# Patient Record
Sex: Male | Born: 1960 | Race: White | Hispanic: No | Smoking: Former smoker
Health system: Southern US, Community
[De-identification: ages and names within clinical notes are randomized; demographics above are authoritative.]

## PROBLEM LIST (undated history)

## (undated) DIAGNOSIS — K219 Gastro-esophageal reflux disease without esophagitis: Secondary | ICD-10-CM

## (undated) DIAGNOSIS — Z0000 Encounter for general adult medical examination without abnormal findings: Secondary | ICD-10-CM

## (undated) DIAGNOSIS — Z1273 Encounter for screening for malignant neoplasm of ovary: Secondary | ICD-10-CM

## (undated) DIAGNOSIS — J019 Acute sinusitis, unspecified: Secondary | ICD-10-CM

## (undated) DIAGNOSIS — M79609 Pain in unspecified limb: Secondary | ICD-10-CM

## (undated) DIAGNOSIS — J309 Allergic rhinitis, unspecified: Secondary | ICD-10-CM

## (undated) DIAGNOSIS — N410 Acute prostatitis: Secondary | ICD-10-CM

## (undated) DIAGNOSIS — M545 Low back pain: Secondary | ICD-10-CM

## (undated) DIAGNOSIS — N400 Enlarged prostate without lower urinary tract symptoms: Secondary | ICD-10-CM

## (undated) DIAGNOSIS — T7840XA Allergy, unspecified, initial encounter: Secondary | ICD-10-CM

## (undated) DIAGNOSIS — G4700 Insomnia, unspecified: Secondary | ICD-10-CM

## (undated) DIAGNOSIS — E785 Hyperlipidemia, unspecified: Secondary | ICD-10-CM

## (undated) DIAGNOSIS — F988 Other specified behavioral and emotional disorders with onset usually occurring in childhood and adolescence: Secondary | ICD-10-CM

## (undated) HISTORY — DX: Acute sinusitis, unspecified: J01.90

## (undated) HISTORY — DX: Encounter for general adult medical examination without abnormal findings: Z00.00

## (undated) HISTORY — DX: Benign prostatic hyperplasia without lower urinary tract symptoms: N40.0

## (undated) HISTORY — DX: Hyperlipidemia, unspecified: E78.5

## (undated) HISTORY — DX: Other specified behavioral and emotional disorders with onset usually occurring in childhood and adolescence: F98.8

## (undated) HISTORY — PX: VASECTOMY: SHX75

## (undated) HISTORY — PX: WISDOM TOOTH EXTRACTION: SHX21

## (undated) HISTORY — DX: Insomnia, unspecified: G47.00

## (undated) HISTORY — DX: Low back pain: M54.5

## (undated) HISTORY — DX: Allergy, unspecified, initial encounter: T78.40XA

## (undated) HISTORY — DX: Gastro-esophageal reflux disease without esophagitis: K21.9

## (undated) HISTORY — DX: Encounter for screening for malignant neoplasm of ovary: Z12.73

## (undated) HISTORY — PX: SKIN CANCER EXCISION: SHX779

## (undated) HISTORY — DX: Acute prostatitis: N41.0

## (undated) HISTORY — DX: Pain in unspecified limb: M79.609

## (undated) HISTORY — DX: Allergic rhinitis, unspecified: J30.9

---

## 2004-01-18 ENCOUNTER — Ambulatory Visit: Payer: Self-pay | Admitting: Internal Medicine

## 2004-04-25 ENCOUNTER — Ambulatory Visit: Payer: Self-pay | Admitting: Internal Medicine

## 2004-10-11 ENCOUNTER — Ambulatory Visit: Payer: Self-pay | Admitting: Internal Medicine

## 2006-03-18 ENCOUNTER — Ambulatory Visit: Payer: Self-pay | Admitting: Internal Medicine

## 2006-03-18 LAB — CONVERTED CEMR LAB
AST: 21 units/L (ref 0–37)
Basophils Absolute: 0.1 10*3/uL (ref 0.0–0.1)
Bilirubin, Direct: 0.1 mg/dL (ref 0.0–0.3)
Chloride: 106 meq/L (ref 96–112)
Cholesterol: 209 mg/dL (ref 0–200)
Creatinine, Ser: 0.9 mg/dL (ref 0.4–1.5)
Direct LDL: 154.9 mg/dL
Eosinophils Absolute: 0.2 10*3/uL (ref 0.0–0.6)
Eosinophils Relative: 4.4 % (ref 0.0–5.0)
Glucose, Bld: 96 mg/dL (ref 70–99)
HCT: 42.8 % (ref 39.0–52.0)
Hemoglobin, Urine: NEGATIVE
Hemoglobin: 15.1 g/dL (ref 13.0–17.0)
Ketones, ur: NEGATIVE mg/dL
Leukocytes, UA: NEGATIVE
MCHC: 35.1 g/dL (ref 30.0–36.0)
MCV: 91.1 fL (ref 78.0–100.0)
Monocytes Absolute: 0.6 10*3/uL (ref 0.2–0.7)
Neutrophils Relative %: 44.8 % (ref 43.0–77.0)
Nitrite: NEGATIVE
PSA: 0.38 ng/mL (ref 0.10–4.00)
Potassium: 4.2 meq/L (ref 3.5–5.1)
RBC: 4.7 M/uL (ref 4.22–5.81)
RDW: 12 % (ref 11.5–14.6)
Sodium: 142 meq/L (ref 135–145)
Total Bilirubin: 0.7 mg/dL (ref 0.3–1.2)
Total CHOL/HDL Ratio: 5.2
Urobilinogen, UA: 0.2 (ref 0.0–1.0)
WBC: 5.4 10*3/uL (ref 4.5–10.5)

## 2006-03-21 ENCOUNTER — Ambulatory Visit: Payer: Self-pay | Admitting: Internal Medicine

## 2007-09-18 ENCOUNTER — Telehealth: Payer: Self-pay | Admitting: Internal Medicine

## 2007-11-03 ENCOUNTER — Encounter: Payer: Self-pay | Admitting: Internal Medicine

## 2007-11-13 ENCOUNTER — Ambulatory Visit: Payer: Self-pay | Admitting: Internal Medicine

## 2007-11-13 LAB — CONVERTED CEMR LAB
ALT: 22 units/L (ref 0–53)
Alkaline Phosphatase: 52 units/L (ref 39–117)
BUN: 13 mg/dL (ref 6–23)
Bilirubin, Direct: 0.1 mg/dL (ref 0.0–0.3)
CO2: 29 meq/L (ref 19–32)
Calcium: 9.6 mg/dL (ref 8.4–10.5)
Eosinophils Relative: 2.8 % (ref 0.0–5.0)
Glucose, Bld: 89 mg/dL (ref 70–99)
Hemoglobin: 14.8 g/dL (ref 13.0–17.0)
Leukocytes, UA: NEGATIVE
Lymphocytes Relative: 39.3 % (ref 12.0–46.0)
Monocytes Relative: 11.9 % (ref 3.0–12.0)
Neutro Abs: 2.5 10*3/uL (ref 1.4–7.7)
Nitrite: NEGATIVE
PSA: 0.24 ng/mL (ref 0.10–4.00)
RBC: 4.54 M/uL (ref 4.22–5.81)
RDW: 11.6 % (ref 11.5–14.6)
Sodium: 142 meq/L (ref 135–145)
Specific Gravity, Urine: 1.015 (ref 1.000–1.03)
Total CHOL/HDL Ratio: 5.4
Total Protein, Urine: NEGATIVE mg/dL
Total Protein: 7.1 g/dL (ref 6.0–8.3)
WBC: 5.6 10*3/uL (ref 4.5–10.5)
pH: 7 (ref 5.0–8.0)

## 2007-11-18 ENCOUNTER — Ambulatory Visit: Payer: Self-pay | Admitting: Internal Medicine

## 2007-11-18 DIAGNOSIS — N400 Enlarged prostate without lower urinary tract symptoms: Secondary | ICD-10-CM

## 2007-11-18 DIAGNOSIS — M545 Low back pain, unspecified: Secondary | ICD-10-CM

## 2007-11-18 DIAGNOSIS — J309 Allergic rhinitis, unspecified: Secondary | ICD-10-CM | POA: Insufficient documentation

## 2007-11-18 DIAGNOSIS — E785 Hyperlipidemia, unspecified: Secondary | ICD-10-CM | POA: Insufficient documentation

## 2007-11-18 DIAGNOSIS — N138 Other obstructive and reflux uropathy: Secondary | ICD-10-CM | POA: Insufficient documentation

## 2007-11-18 HISTORY — DX: Hyperlipidemia, unspecified: E78.5

## 2007-11-18 HISTORY — DX: Benign prostatic hyperplasia without lower urinary tract symptoms: N40.0

## 2007-11-18 HISTORY — DX: Allergic rhinitis, unspecified: J30.9

## 2007-11-18 HISTORY — DX: Low back pain, unspecified: M54.50

## 2008-01-04 ENCOUNTER — Telehealth: Payer: Self-pay | Admitting: Internal Medicine

## 2008-02-24 ENCOUNTER — Ambulatory Visit: Payer: Self-pay | Admitting: Internal Medicine

## 2008-02-24 DIAGNOSIS — N410 Acute prostatitis: Secondary | ICD-10-CM | POA: Insufficient documentation

## 2008-02-24 DIAGNOSIS — F988 Other specified behavioral and emotional disorders with onset usually occurring in childhood and adolescence: Secondary | ICD-10-CM

## 2008-02-24 DIAGNOSIS — M79609 Pain in unspecified limb: Secondary | ICD-10-CM

## 2008-02-24 HISTORY — DX: Other specified behavioral and emotional disorders with onset usually occurring in childhood and adolescence: F98.8

## 2008-02-24 HISTORY — DX: Acute prostatitis: N41.0

## 2008-02-24 HISTORY — DX: Pain in unspecified limb: M79.609

## 2008-02-24 LAB — CONVERTED CEMR LAB
Bacteria, UA: NEGATIVE
Ketones, ur: NEGATIVE mg/dL
Mucus, UA: NEGATIVE
RBC / HPF: NONE SEEN
Specific Gravity, Urine: 1.015 (ref 1.000–1.03)
Urine Glucose: NEGATIVE mg/dL
pH: 8 (ref 5.0–8.0)

## 2008-10-11 ENCOUNTER — Encounter: Payer: Self-pay | Admitting: Internal Medicine

## 2008-12-23 ENCOUNTER — Ambulatory Visit: Payer: Self-pay | Admitting: Internal Medicine

## 2008-12-23 LAB — CONVERTED CEMR LAB
ALT: 31 units/L (ref 0–53)
Albumin: 4.5 g/dL (ref 3.5–5.2)
BUN: 12 mg/dL (ref 6–23)
Basophils Relative: 0.2 % (ref 0.0–3.0)
Chloride: 105 meq/L (ref 96–112)
Cholesterol: 214 mg/dL — ABNORMAL HIGH (ref 0–200)
Creatinine, Ser: 1 mg/dL (ref 0.4–1.5)
Direct LDL: 158 mg/dL
Eosinophils Absolute: 0.1 10*3/uL (ref 0.0–0.7)
Hemoglobin: 15.3 g/dL (ref 13.0–17.0)
Ketones, ur: NEGATIVE mg/dL
MCHC: 33.9 g/dL (ref 30.0–36.0)
MCV: 96.6 fL (ref 78.0–100.0)
Monocytes Absolute: 0.7 10*3/uL (ref 0.1–1.0)
Neutro Abs: 3.4 10*3/uL (ref 1.4–7.7)
RBC: 4.68 M/uL (ref 4.22–5.81)
Specific Gravity, Urine: 1.015 (ref 1.000–1.030)
TSH: 0.8 microintl units/mL (ref 0.35–5.50)
Total Bilirubin: 0.7 mg/dL (ref 0.3–1.2)
Total Protein, Urine: NEGATIVE mg/dL
Urine Glucose: NEGATIVE mg/dL
Urobilinogen, UA: 0.2 (ref 0.0–1.0)
VLDL: 15 mg/dL (ref 0.0–40.0)
pH: 6.5 (ref 5.0–8.0)

## 2008-12-28 ENCOUNTER — Ambulatory Visit: Payer: Self-pay | Admitting: Internal Medicine

## 2008-12-28 DIAGNOSIS — J019 Acute sinusitis, unspecified: Secondary | ICD-10-CM

## 2008-12-28 HISTORY — DX: Acute sinusitis, unspecified: J01.90

## 2009-07-26 ENCOUNTER — Telehealth: Payer: Self-pay | Admitting: Internal Medicine

## 2009-09-26 ENCOUNTER — Encounter: Payer: Self-pay | Admitting: Internal Medicine

## 2009-10-24 ENCOUNTER — Telehealth (INDEPENDENT_AMBULATORY_CARE_PROVIDER_SITE_OTHER): Payer: Self-pay | Admitting: *Deleted

## 2009-10-25 ENCOUNTER — Encounter: Payer: Self-pay | Admitting: Internal Medicine

## 2009-10-25 DIAGNOSIS — G4700 Insomnia, unspecified: Secondary | ICD-10-CM

## 2009-10-25 HISTORY — DX: Insomnia, unspecified: G47.00

## 2009-10-26 ENCOUNTER — Encounter: Payer: Self-pay | Admitting: Internal Medicine

## 2010-01-26 ENCOUNTER — Telehealth: Payer: Self-pay | Admitting: Internal Medicine

## 2010-03-06 NOTE — Progress Notes (Signed)
Summary: PA-Aimbien  Phone Note From Pharmacy   Summary of Call: PA-[facility]imbien, called [facility] @ [phone], awaiting  form. Scott Walker  October 24, 2009 3:39 PM  PA Faxed tp [facility] @ [phone], case # [id number], awaiting approval. Scott Walker  October 26, 2009 12:28 PM  PA-Zolpidem approved 10/05/09-10/26/10, case # [id number], pt aware.  Initial call taken by: Scott Walker,  October 27, 2009 10:53 AM

## 2010-03-06 NOTE — Progress Notes (Signed)
Summary: medication Refill  Phone Note Refill Request Message from:  Fax from Pharmacy on July 26, 2009 9:58 AM  Refills Requested: Medication #1:  AMBIEN 10 MG  TABS 1 by mouth at bedtime as needed   Dosage confirmed as above?Dosage Confirmed   Last Refilled: 12/28/2008   Notes: [facility] fax#[phone] Initial call taken by: Zella Ball Ewing CMA Duncan Dull),  July 26, 2009 9:58 AM  Follow-up for Phone Call        Rx faxed to pharmacy Follow-up by: Margaret Pyle, CMA,  July 26, 2009 10:33 AM    New/Updated Medications: AMBIEN 10 MG  TABS (ZOLPIDEM TARTRATE) 1 by mouth at bedtime as needed Prescriptions: AMBIEN 10 MG  TABS (ZOLPIDEM TARTRATE) 1 by mouth at bedtime as needed  #90 x 1   Entered and Authorized by:   Corwin Levins MD   Signed by:   Corwin Levins MD on 07/26/2009   Method used:   Print then Give to Patient   RxID:   [id number]  done hardcopy to [facility] side B - dahlia  Corwin Levins MD  July 26, 2009 10:27 AM

## 2010-03-06 NOTE — Letter (Signed)
Summary: [employer] Screening  [employer] Screening   Imported By: Sherian Rein 09/28/2009 08:09:18  _____________________________________________________________________  External Attachment:    Type:   Image     Comment:   External Document

## 2010-03-06 NOTE — Medication Information (Signed)
Summary: Approval and P Auth/[facility]  Approval and P Auth/[facility]   Imported By: Lester [place] 10/31/2009 09:06:22  _____________________________________________________________________  External Attachment:    Type:   Image     Comment:   External Document

## 2010-03-06 NOTE — Miscellaneous (Signed)
  Clinical Lists Changes  Problems: Added new problem of INSOMNIA-SLEEP DISORDER-UNSPEC (ICD-780.52)

## 2010-03-08 NOTE — Progress Notes (Signed)
Summary: medication refill  Phone Note Refill Request Message from:  Fax from Pharmacy on January 26, 2010 8:47 AM  Refills Requested: Medication #1:  AMBIEN 10 MG  TABS 1 by mouth at bedtime as needed   Dosage confirmed as above?Dosage Confirmed   Last Refilled: 07/26/2009   Notes: [facility], [phone] Initial call taken by: Robin Ewing CMA (AAMA),  January 26, 2010 8:48 AM    New/Updated Medications: AMBIEN 10 MG  TABS (ZOLPIDEM TARTRATE) 1 by mouth at bedtime as needed - please make return office visit for further refills Prescriptions: AMBIEN 10 MG  TABS (ZOLPIDEM TARTRATE) 1 by mouth at bedtime as needed - please make return office visit for further refills  #90 x 0   Entered and Authorized by:   Corwin Levins MD   Signed by:   Corwin Levins MD on 01/26/2010   Method used:   Print then Give to Patient   RxID:   [id number]  done hardcopy to [facility] side B - dahlia  Corwin Levins MD  January 26, 2010 10:48 AM   Rx faxed to pharmacy Margaret Pyle, CMA  January 26, 2010 10:53 AM

## 2010-04-26 ENCOUNTER — Ambulatory Visit: Payer: 59

## 2010-04-26 DIAGNOSIS — Z0000 Encounter for general adult medical examination without abnormal findings: Secondary | ICD-10-CM

## 2010-04-26 DIAGNOSIS — Z1273 Encounter for screening for malignant neoplasm of ovary: Secondary | ICD-10-CM

## 2010-04-26 HISTORY — DX: Encounter for general adult medical examination without abnormal findings: Z00.00

## 2010-04-26 HISTORY — DX: Encounter for screening for malignant neoplasm of ovary: Z12.73

## 2010-04-26 LAB — BASIC METABOLIC PANEL
CO2: 25 mEq/L (ref 19–32)
Chloride: 104 mEq/L (ref 96–112)
Sodium: 136 mEq/L (ref 135–145)

## 2010-04-26 LAB — CBC WITH DIFFERENTIAL/PLATELET
Basophils Absolute: 0 10*3/uL (ref 0.0–0.1)
Eosinophils Relative: 2.7 % (ref 0.0–5.0)
Hemoglobin: 14.8 g/dL (ref 13.0–17.0)
Lymphocytes Relative: 32.5 % (ref 12.0–46.0)
Monocytes Relative: 11.2 % (ref 3.0–12.0)
Neutro Abs: 3.2 10*3/uL (ref 1.4–7.7)
Platelets: 255 10*3/uL (ref 150.0–400.0)
RDW: 13.5 % (ref 11.5–14.6)
WBC: 6 10*3/uL (ref 4.5–10.5)

## 2010-04-26 LAB — URINALYSIS
Leukocytes, UA: NEGATIVE
Nitrite: NEGATIVE
Specific Gravity, Urine: <=1.005 (ref 1.000–1.030)
pH: 5.5 (ref 5.0–8.0)

## 2010-04-26 LAB — HEPATIC FUNCTION PANEL
ALT: 33 U/L (ref 0–53)
Alkaline Phosphatase: 42 U/L (ref 39–117)
Bilirubin, Direct: 0.1 mg/dL (ref 0.0–0.3)
Total Protein: 6.6 g/dL (ref 6.0–8.3)

## 2010-04-26 LAB — LIPID PANEL: Total CHOL/HDL Ratio: 4

## 2010-04-26 LAB — PSA: PSA: 0.38 ng/mL (ref 0.10–4.00)

## 2010-05-03 ENCOUNTER — Encounter: Payer: Self-pay | Admitting: Internal Medicine

## 2010-05-03 ENCOUNTER — Ambulatory Visit (INDEPENDENT_AMBULATORY_CARE_PROVIDER_SITE_OTHER): Payer: 59 | Admitting: Internal Medicine

## 2010-05-03 VITALS — BP 130/80 | HR 64 | Temp 97.9°F | Ht 69.0 in | Wt 164.2 lb

## 2010-05-03 DIAGNOSIS — E785 Hyperlipidemia, unspecified: Secondary | ICD-10-CM

## 2010-05-03 DIAGNOSIS — Z0001 Encounter for general adult medical examination with abnormal findings: Secondary | ICD-10-CM | POA: Insufficient documentation

## 2010-05-03 DIAGNOSIS — Z0000 Encounter for general adult medical examination without abnormal findings: Secondary | ICD-10-CM | POA: Insufficient documentation

## 2010-05-03 DIAGNOSIS — M545 Low back pain, unspecified: Secondary | ICD-10-CM

## 2010-05-03 DIAGNOSIS — G4700 Insomnia, unspecified: Secondary | ICD-10-CM

## 2010-05-03 MED ORDER — TRAMADOL HCL 50 MG PO TABS: 50.0000 mg | ORAL_TABLET | Freq: Three times a day (TID) | ORAL | Status: DC | PRN

## 2010-05-03 MED ORDER — ZOLPIDEM TARTRATE 10 MG PO TABS: 10.0000 mg | ORAL_TABLET | Freq: Every evening | ORAL | Status: DC | PRN

## 2010-05-03 MED ORDER — FINASTERIDE 5 MG PO TABS: 5.0000 mg | ORAL_TABLET | Freq: Every day | ORAL | Status: DC

## 2010-05-03 NOTE — Assessment & Plan Note (Signed)
stable overall by hx and exam, most recent lab reviewed with pt, and pt to continue medical treatment as before 

## 2010-05-03 NOTE — Assessment & Plan Note (Signed)

## 2010-05-03 NOTE — Assessment & Plan Note (Signed)
stable overall by hx and exam, most recent lab reviewed with pt, and pt to continue medical treatment as before  For med refill today

## 2010-05-03 NOTE — Assessment & Plan Note (Addendum)
Currently not takiing the welchl as he is out, will cont as is for now, check lipids as above, goal ldl < 100

## 2010-05-03 NOTE — Patient Instructions (Signed)
The current medical regimen is effective;  continue present plan and medications.  Please return in 1 year for your yearly visit, or sooner if needed, with Lab testing done 3-5 days before

## 2010-05-03 NOTE — Progress Notes (Signed)
Subjective:    Patient ID: Scott Walker, male    DOB: Oct 09, 1960, 50 y.o.   MRN: [id number]  HPI  Here for wellness and f/u;  Overall doing ok;  Pt denies CP, worsening SOB, DOE, wheezing, orthopnea, PND, worsening LE edema, palpitations, dizziness or syncope.  Pt denies neurological change such as new Headache, facial or extremity weakness.  Pt denies polydipsia, polyuria, or low sugar symptoms. Pt states overall good compliance with treatment and medications, good tolerability, and trying to follow lower cholesterol diet.  Pt denies worsening depressive symptoms, suicidal ideation or panic. No fever, wt loss, night sweats, loss of appetite, or other constitutional symptoms.  Pt states good ability with ADL's, low fall risk, home safety reviewed and adequate, no significant changes in hearing or vision, and occasionally active with exercise.  Does need tramadol refill for the back pain -; just finished a bottle 60 pills from 2 yrs ago - only uses very occasionally  And Pt continues to have recurring LBP without change in severity, bowel or bladder change, fever, wt loss,  worsening LE pain/numbness/weakness, gait change or falls.    Past Medical History  Diagnosis Date  . HYPERLIPIDEMIA 11/18/2007  . ADD 02/24/2008  . SINUSITIS- ACUTE-NOS 12/28/2008  . ALLERGIC RHINITIS 11/18/2007  . BENIGN PROSTATIC HYPERTROPHY 11/18/2007  . PROSTATITIS, ACUTE 02/24/2008  . LOW BACK PAIN 11/18/2007  . ARM PAIN, LEFT 02/24/2008  . INSOMNIA-SLEEP DISORDER-UNSPEC 10/25/2009  . Routine general medical examination at a health care facility 04/26/2010  . Special screening for malignant neoplasms, ovary 04/26/2010   Past Surgical History  Procedure Date  . Vasectomy     reports that he has been smoking Cigars.  He does not have any smokeless tobacco history on file. He reports that he drinks alcohol. His drug history not on file. family history is not on file. Allergies  Allergen Reactions  . Statins     Current Outpatient Prescriptions on File Prior to Visit  Medication Sig Dispense Refill  . aspirin 81 MG EC tablet Take 81 mg by mouth daily.        . Colesevelam HCl [facility]) 3.75 G PACK Take by mouth. 1 pack with water per day       . finasteride (PROSCAR) 5 MG tablet Take 5 mg by mouth daily.        . fluticasone (FLONASE) 50 MCG/ACT nasal spray 2 sprays by Nasal route daily.        . traMADol (ULTRAM) 50 MG tablet Take 50 mg by mouth. 1 by mouth  Two times a day as needed for pain       . zolpidem (AMBIEN) 10 MG tablet Take 10 mg by mouth at bedtime as needed. Please make return visit for further refills       . azithromycin (ZITHROMAX) 250 MG tablet Take 2 tablets by mouth on day 1, followed by 1 tablet by mouth daily for 4 days. 2 by mouth for 1 day then 1 by mouth for 4 days then stop          Review of Systems Review of Systems  Constitutional: Negative for diaphoresis, activity change, appetite change and unexpected weight change.  HENT: Negative for hearing loss, ear pain, facial swelling, mouth sores and neck stiffness.   Eyes: Negative for pain, redness and visual disturbance.  Respiratory: Negative for shortness of breath and wheezing.   Cardiovascular: Negative for chest pain and palpitations.  Gastrointestinal: Negative for diarrhea, blood in stool, abdominal  distention and rectal pain.  Genitourinary: Negative for hematuria, flank pain and decreased urine volume.  Musculoskeletal: Negative for myalgias and joint swelling.  Skin: Negative for color change and wound.  Neurological: Negative for syncope and numbness.  Hematological: Negative for adenopathy.  Psychiatric/Behavioral: Negative for hallucinations, self-injury, decreased concentration and agitation.      Objective:   Physical Exam Physical Exam  VS noted Constitutional: Pt is oriented to person, place, and time. Appears well-developed and well-nourished.  HENT:  Head: Normocephalic and atraumatic.  Right  Ear: External ear normal.  Left Ear: External ear normal.  Nose: Nose normal.  Mouth/Throat: Oropharynx is clear and moist.  Eyes: Conjunctivae and EOM are normal. Pupils are equal, round, and reactive to light.  Neck: Normal range of motion. Neck supple. No JVD present. No tracheal deviation present.  Cardiovascular: Normal rate, regular rhythm, normal heart sounds and intact distal pulses.   Pulmonary/Chest: Effort normal and breath sounds normal.  Abdominal: Soft. Bowel sounds are normal. There is no tenderness.  Musculoskeletal: Normal range of motion. Exhibits no edema.  Lymphadenopathy:  Has no cervical adenopathy.  Neurological: Pt is alert and oriented to person, place, and time. Pt has normal reflexes. No cranial nerve deficit.  Skin: Skin is warm and dry. No rash noted.  Psychiatric:  Has  normal mood and affect. Behavior is normal.         Assessment & Plan:

## 2010-11-06 ENCOUNTER — Other Ambulatory Visit: Payer: Self-pay

## 2010-11-06 MED ORDER — ZOLPIDEM TARTRATE 10 MG PO TABS: ORAL_TABLET | ORAL | Status: DC

## 2010-11-06 NOTE — Telephone Encounter (Signed)
Done hardcopy to robin  

## 2010-11-07 NOTE — Telephone Encounter (Signed)
Faxed hardcopy to [facility] at [phone]

## 2011-01-15 ENCOUNTER — Other Ambulatory Visit: Payer: Self-pay

## 2011-01-15 DIAGNOSIS — M545 Low back pain, unspecified: Secondary | ICD-10-CM

## 2011-01-15 MED ORDER — FINASTERIDE 5 MG PO TABS: 5.0000 mg | ORAL_TABLET | Freq: Every day | ORAL | Status: DC

## 2011-01-15 MED ORDER — TRAMADOL HCL 50 MG PO TABS: 50.0000 mg | ORAL_TABLET | Freq: Three times a day (TID) | ORAL | Status: DC | PRN

## 2011-01-15 NOTE — Telephone Encounter (Signed)
Done per emr 

## 2011-01-21 ENCOUNTER — Telehealth: Payer: Self-pay

## 2011-01-21 NOTE — Telephone Encounter (Signed)
[facility] request clarification on Tramadol directions. Take 1 tab by mouth every 8 hrs prn for pain or take 1 tab two times a day prn pain, Please advise

## 2011-01-21 NOTE — Telephone Encounter (Signed)
Bid prn would be correct

## 2011-01-22 NOTE — Telephone Encounter (Signed)
Called pharmacy left detailed message of MD's correct instructions on Tramadol

## 2011-02-14 ENCOUNTER — Telehealth: Payer: Self-pay

## 2011-02-14 NOTE — Telephone Encounter (Signed)
I suspect ? Neurological in nature;  If no assoc pain,sob, diaphoresis, palp, dizziness, n/v can watch for now, consider OV if persists in 1-2 days

## 2011-02-14 NOTE — Telephone Encounter (Signed)
Pt advised and will call back PRN 

## 2011-02-14 NOTE — Telephone Encounter (Signed)
After Dr Jonny Ruiz reviewed - Monitor sxs and make OV if persistent. MD aware no associated sxs per pt.

## 2011-02-14 NOTE — Telephone Encounter (Signed)
Pt called c/o of tingling in LT arm that has been going on all day. Pt denied any other sxs such as SOB, HA, diaphoresis. Pt says that sxs began when he woke up this morning and his arm was numb. He thought that it was related to sleeping position but tingling has persisted. Pt is requesting MD advise - OV?

## 2011-07-05 ENCOUNTER — Other Ambulatory Visit: Payer: Self-pay

## 2011-07-05 MED ORDER — ZOLPIDEM TARTRATE 10 MG PO TABS: ORAL_TABLET | ORAL | Status: DC

## 2011-07-05 NOTE — Telephone Encounter (Signed)
Done hardcopy to robin  

## 2011-07-05 NOTE — Telephone Encounter (Signed)
Faxed hardcopy to pharmacy. 

## 2011-08-27 ENCOUNTER — Emergency Department (HOSPITAL_COMMUNITY)
Admission: EM | Admit: 2011-08-27 | Discharge: 2011-08-27 | Disposition: A | Discharge status: Home / Self Care | Payer: 59 | Attending: Emergency Medicine | Admitting: Emergency Medicine

## 2011-08-27 ENCOUNTER — Encounter (HOSPITAL_COMMUNITY): Payer: Self-pay | Admitting: Emergency Medicine

## 2011-08-27 DIAGNOSIS — F17200 Nicotine dependence, unspecified, uncomplicated: Secondary | ICD-10-CM | POA: Insufficient documentation

## 2011-08-27 DIAGNOSIS — F988 Other specified behavioral and emotional disorders with onset usually occurring in childhood and adolescence: Secondary | ICD-10-CM | POA: Insufficient documentation

## 2011-08-27 DIAGNOSIS — T189XXA Foreign body of alimentary tract, part unspecified, initial encounter: Secondary | ICD-10-CM | POA: Insufficient documentation

## 2011-08-27 DIAGNOSIS — T18108A Unspecified foreign body in esophagus causing other injury, initial encounter: Secondary | ICD-10-CM

## 2011-08-27 DIAGNOSIS — IMO0002 Reserved for concepts with insufficient information to code with codable children: Secondary | ICD-10-CM | POA: Insufficient documentation

## 2011-08-27 DIAGNOSIS — E785 Hyperlipidemia, unspecified: Secondary | ICD-10-CM | POA: Insufficient documentation

## 2011-08-27 MED ORDER — GLUCAGON HCL (RDNA) 1 MG IJ SOLR
1.0000 mg | Freq: Once | INTRAMUSCULAR | Status: AC
  Administered 2011-08-27: 1 mg via INTRAMUSCULAR
  Filled 2011-08-27: qty 1

## 2011-08-27 NOTE — ED Provider Notes (Signed)
History     CSN: [id number]  Arrival date & time 08/27/11  1644   First MD Initiated Contact with Patient 08/27/11 1822      Chief Complaint  Patient presents with  . Swallowed Foreign Body    (Consider location/radiation/quality/duration/timing/severity/associated sxs/prior treatment) Patient is a 51 y.o. male presenting with foreign body swallowed. The history is provided by the patient.  Swallowed Foreign Body This is a new problem. The current episode started today. The problem occurs constantly. The problem has been unchanged. Associated symptoms include a sore throat. Pertinent negatives include no abdominal pain, chest pain, congestion, coughing, fatigue, fever, headaches, nausea, neck pain, numbness, rash, vomiting or weakness. The symptoms are aggravated by drinking and eating. He has tried nothing for the symptoms. The treatment provided no relief.    Past Medical History  Diagnosis Date  . HYPERLIPIDEMIA 11/18/2007  . ADD 02/24/2008  . SINUSITIS- ACUTE-NOS 12/28/2008  . ALLERGIC RHINITIS 11/18/2007  . BENIGN PROSTATIC HYPERTROPHY 11/18/2007  . PROSTATITIS, ACUTE 02/24/2008  . LOW BACK PAIN 11/18/2007  . ARM PAIN, LEFT 02/24/2008  . INSOMNIA-SLEEP DISORDER-UNSPEC 10/25/2009  . Routine general medical examination at a health care facility 04/26/2010  . Special screening for malignant neoplasms, ovary 04/26/2010    Past Surgical History  Procedure Date  . Vasectomy     History reviewed. No pertinent family history.  History  Substance Use Topics  . Smoking status: Current Some Day Smoker    Types: Cigars  . Smokeless tobacco: Not on file   Comment: occasional cigar  . Alcohol Use: Yes     moderate      Review of Systems  Constitutional: Negative for fever, activity change, appetite change and fatigue.  HENT: Positive for sore throat and drooling. Negative for congestion, facial swelling, rhinorrhea, trouble swallowing, neck pain, neck stiffness, voice change  and sinus pressure.   Eyes: Negative.   Respiratory: Negative for cough, choking, chest tightness, shortness of breath and wheezing.   Cardiovascular: Negative for chest pain.  Gastrointestinal: Negative for nausea, vomiting and abdominal pain.  Genitourinary: Negative for dysuria, urgency, frequency, hematuria, flank pain and difficulty urinating.  Musculoskeletal: Negative for back pain and gait problem.  Skin: Negative for rash and wound.  Neurological: Negative for facial asymmetry, weakness, numbness and headaches.  Psychiatric/Behavioral: Negative for behavioral problems, confusion and agitation. The patient is not nervous/anxious and is not hyperactive.   All other systems reviewed and are negative.    Allergies  Statins  Home Medications   Current Outpatient Rx  Name Route Sig Dispense Refill  . ASPIRIN 81 MG PO TBEC Oral Take 81 mg by mouth daily.      Marland Kitchen FINASTERIDE 5 MG PO TABS Oral Take 5 mg by mouth daily.    . TRAMADOL HCL 50 MG PO TABS Oral Take 50 mg by mouth every 8 (eight) hours as needed. For pain    . ZOLPIDEM TARTRATE 10 MG PO TABS Oral Take 10 mg by mouth at bedtime as needed. For sleep      BP 133/93  Pulse 75  Temp 98.5 F (36.9 C) (Oral)  Resp 16  SpO2 97%  Physical Exam  Nursing note and vitals reviewed. Constitutional: He is oriented to person, place, and time. He appears well-developed and well-nourished. No distress.  HENT:  Head: Normocephalic and atraumatic.  Right Ear: External ear normal.  Left Ear: External ear normal.  Mouth/Throat: No oropharyngeal exudate.  Eyes: Conjunctivae and EOM are normal. Pupils are equal,  round, and reactive to light. Right eye exhibits no discharge. Left eye exhibits no discharge.  Neck: Normal range of motion. Neck supple. No JVD present. No tracheal deviation present. No thyromegaly present.  Cardiovascular: Normal rate, regular rhythm, normal heart sounds and intact distal pulses.  Exam reveals no gallop and  no friction rub.   No murmur heard. Pulmonary/Chest: Effort normal and breath sounds normal. No respiratory distress. He has no wheezes. He exhibits no tenderness.  Abdominal: Soft. Bowel sounds are normal. He exhibits no distension. There is no tenderness. There is no rebound and no guarding.  Musculoskeletal: Normal range of motion. He exhibits no edema and no tenderness.  Lymphadenopathy:    He has no cervical adenopathy.  Neurological: He is alert and oriented to person, place, and time. No cranial nerve deficit.  Skin: Skin is warm and dry. No rash noted. He is not diaphoretic. No pallor.  Psychiatric: He has a normal mood and affect. His behavior is normal.    ED Course  Procedures (including critical care time)  Labs Reviewed - No data to display No results found.   No diagnosis found.    MDM  51 year old male patient with noncontributory past medical history presents after swallowing a chicken sandwich in the sensation that it got stuck in his throat. Patient not able swallow since. Patient says that when he swallows water immediately comes back up. Patient complains of a foreign body sensation in his throat but no chest pain nausea vomiting or abdominal pain. Patient says he has not had problems with swallowing recently and that this is new. Patient stating secretions out of his mouth not handling secretions. We'll attempt to clear impaction with a dose of glucagon, if that does not work we'll consult to have EGD as patient is not handling secretions.  Patient asymptomatic after dose of glucagon. Patient eating french fries and drinking 8 ounces of fluid with no symptoms. We'll have patient followup with GI physician to investigate etiology of obstruction is symptom-free at this point in time.  Case discussed with Dr. Bernerd Limbo, MD 08/28/11 [phone]

## 2011-08-27 NOTE — ED Notes (Addendum)
Pt reports that he feels like having more difficulty breathing; notified charge RN- and pt to move to pod b; pt spo2 99%

## 2011-08-27 NOTE — ED Notes (Signed)
PT. ABLE TO DRINK A CUP OF SPRITE WITHOUT DIFFICULTY , RESPIRATIONS UNLABORED.

## 2011-08-27 NOTE — ED Notes (Addendum)
Reports was eating chicken sandwich at lunch and reports that it is stuck in throat; has tried taking tums but has thrown it back up; pt using spit cup for secretions- controlling secretions; pt denies difficulty breathing; denies any history of same

## 2011-08-28 NOTE — ED Provider Notes (Signed)
I saw and evaluated the patient, reviewed the resident's note and I agree with the findings and plan.   Pt with food impaction, not pain, difficulty handling secretions, but no stridor or wheezing.  Glucagon given, and with time, symptoms improved. Able to eat and drink fluids without difficulty.   Referral given to [facility].    Gavin Pound. Oletta Lamas, MD 08/28/11 2138

## 2011-12-10 ENCOUNTER — Other Ambulatory Visit: Payer: Self-pay | Admitting: Internal Medicine

## 2012-02-18 ENCOUNTER — Other Ambulatory Visit: Payer: Self-pay | Admitting: *Deleted

## 2012-02-18 MED ORDER — ZOLPIDEM TARTRATE 10 MG PO TABS: 10.0000 mg | ORAL_TABLET | Freq: Every evening | ORAL | Status: DC | PRN

## 2012-02-18 NOTE — Telephone Encounter (Signed)
Done hardcopy to robin  

## 2012-02-18 NOTE — Telephone Encounter (Signed)
PATIENT CALLED REQUEST REFILL ON ZOLPIDEM. STATED PHARMACY TOLD HIM THAT A REFILL REQUEST WAS SENT HERE ON Saturday.Scott Walker PATIENT STATES HE HAS SCHEDULED A APPOINTMENT FOR 3 /2014 WITH DR. Jonny Ruiz. PLEASE REFILL FOR SLEEP .  CALL BACK # [phone]. PHARMACY [facility].  LAST OV 2012

## 2012-02-18 NOTE — Telephone Encounter (Signed)
Faxed hardcopy to pharmacy and called the patient to inform

## 2012-04-13 ENCOUNTER — Ambulatory Visit (INDEPENDENT_AMBULATORY_CARE_PROVIDER_SITE_OTHER): Payer: Managed Care, Other (non HMO) | Admitting: Internal Medicine

## 2012-04-13 ENCOUNTER — Encounter: Payer: Self-pay | Admitting: Internal Medicine

## 2012-04-13 VITALS — BP 112/80 | HR 84 | Temp 98.0°F | Ht 69.0 in | Wt 171.5 lb

## 2012-04-13 DIAGNOSIS — Z0000 Encounter for general adult medical examination without abnormal findings: Secondary | ICD-10-CM

## 2012-04-13 DIAGNOSIS — E785 Hyperlipidemia, unspecified: Secondary | ICD-10-CM

## 2012-04-13 MED ORDER — ZOLPIDEM TARTRATE 10 MG PO TABS: 10.0000 mg | ORAL_TABLET | Freq: Every evening | ORAL | Status: DC | PRN

## 2012-04-13 NOTE — Assessment & Plan Note (Signed)
For refill lipitor,  to f/u any worsening symptoms or concerns  Lab Results  Component Value Date   LDLCALC 110* 04/26/2010

## 2012-04-13 NOTE — Patient Instructions (Addendum)
You will be contacted regarding the referral for: colonoscopy Please continue all other medications as before Please have the pharmacy call with any other refills you may need. Please continue your efforts at being more active, low cholesterol diet, and weight control. You are otherwise up to date with prevention measures today. Thank you for enrolling in MyChart. Please follow the instructions below to securely access your online medical record. MyChart allows you to send messages to your doctor, view your test results, renew your prescriptions, schedule appointments, and more. To Log into My Chart online, please go by [employer] or [employer] to [email or web address], or download the MyChart App from the [employer] of [employer] .  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) Please send a Engineer, water on Mychart later today. Please return in 1 year for your yearly visit, or sooner if needed, with Lab testing done 3-5 days before

## 2012-04-13 NOTE — Progress Notes (Signed)
Subjective:    Patient ID: Scott Walker, male    DOB: 1961-01-17, 52 y.o.   MRN: [id number]  HPI  Here for wellness and f/u;  Overall doing ok;  Pt denies CP, worsening SOB, DOE, wheezing, orthopnea, PND, worsening LE edema, palpitations, dizziness or syncope.  Pt denies neurological change such as new headache, facial or extremity weakness.  Pt denies polydipsia, polyuria, or low sugar symptoms. Pt states overall good compliance with treatment and medications, good tolerability, and has been trying to follow lower cholesterol diet.  Pt denies worsening depressive symptoms, suicidal ideation or panic. No fever, night sweats, wt loss, loss of appetite, or other constitutional symptoms.  Pt states good ability with ADL's, has low fall risk, home safety reviewed and adequate, no other significant changes in hearing or vision, and only occasionally active with exercise.  Still with ongoing sleep difficulty with getting to sleep. Past Medical History  Diagnosis Date  . HYPERLIPIDEMIA 11/18/2007  . ADD 02/24/2008  . SINUSITIS- ACUTE-NOS 12/28/2008  . ALLERGIC RHINITIS 11/18/2007  . BENIGN PROSTATIC HYPERTROPHY 11/18/2007  . PROSTATITIS, ACUTE 02/24/2008  . LOW BACK PAIN 11/18/2007  . ARM PAIN, LEFT 02/24/2008  . INSOMNIA-SLEEP DISORDER-UNSPEC 10/25/2009  . Routine general medical examination at a health care facility 04/26/2010  . Special screening for malignant neoplasms, ovary 04/26/2010   Past Surgical History  Procedure Laterality Date  . Vasectomy      reports that he has been smoking Cigars.  He does not have any smokeless tobacco history on file. He reports that  drinks alcohol. His drug history is not on file. family history includes Hyperlipidemia in an unspecified family member. Allergies  Allergen Reactions  . Statins    Current Outpatient Prescriptions on File Prior to Visit  Medication Sig Dispense Refill  . aspirin 81 MG EC tablet Take 81 mg by mouth daily.        .  finasteride (PROSCAR) 5 MG tablet Take 5 mg by mouth daily.      . traMADol (ULTRAM) 50 MG tablet Take 50 mg by mouth every 8 (eight) hours as needed. For pain       No current facility-administered medications on file prior to visit.   Review of Systems Constitutional: Negative for diaphoresis, activity change, appetite change or unexpected weight change.  HENT: Negative for hearing loss, ear pain, facial swelling, mouth sores and neck stiffness.   Eyes: Negative for pain, redness and visual disturbance.  Respiratory: Negative for shortness of breath and wheezing.   Cardiovascular: Negative for chest pain and palpitations.  Gastrointestinal: Negative for diarrhea, blood in stool, abdominal distention or other pain Genitourinary: Negative for hematuria, flank pain or change in urine volume.  Musculoskeletal: Negative for myalgias and joint swelling.  Skin: Negative for color change and wound.  Neurological: Negative for syncope and numbness. other than noted Hematological: Negative for adenopathy.  Psychiatric/Behavioral: Negative for hallucinations, self-injury, decreased concentration and agitation.      Objective:   Physical Exam BP 112/80  Pulse 84  Temp(Src) 98 F (36.7 C) (Oral)  Ht 5\' 9"  (1.753 m)  Wt 171 lb 8 oz (77.792 kg)  BMI 25.31 kg/m2  SpO2 97% VS noted,  Constitutional: Pt is oriented to person, place, and time. Appears well-developed and well-nourished.  Head: Normocephalic and atraumatic.  Right Ear: External ear normal.  Left Ear: External ear normal.  Nose: Nose normal.  Mouth/Throat: Oropharynx is clear and moist.  Eyes: Conjunctivae and EOM are normal. Pupils are  equal, round, and reactive to light.  Neck: Normal range of motion. Neck supple. No JVD present. No tracheal deviation present.  Cardiovascular: Normal rate, regular rhythm, normal heart sounds and intact distal pulses.   Pulmonary/Chest: Effort normal and breath sounds normal.  Abdominal: Soft.  Bowel sounds are normal. There is no tenderness. No HSM  Musculoskeletal: Normal range of motion. Exhibits no edema.  Lymphadenopathy:  Has no cervical adenopathy.  Neurological: Pt is alert and oriented to person, place, and time. Pt has normal reflexes. No cranial nerve deficit.  Skin: Skin is warm and dry. No rash noted.  Psychiatric:  Has  normal mood and affect. Behavior is normal.     Assessment & Plan:

## 2012-04-13 NOTE — Assessment & Plan Note (Signed)

## 2012-10-30 ENCOUNTER — Other Ambulatory Visit: Payer: Self-pay | Admitting: Internal Medicine

## 2012-11-05 ENCOUNTER — Other Ambulatory Visit: Payer: Self-pay | Admitting: Internal Medicine

## 2012-11-05 NOTE — Telephone Encounter (Signed)
Done hardcopy to robin  

## 2012-11-05 NOTE — Telephone Encounter (Signed)
[facility].../[facility] 

## 2012-12-10 ENCOUNTER — Other Ambulatory Visit: Payer: Self-pay

## 2013-05-14 ENCOUNTER — Other Ambulatory Visit: Payer: Self-pay | Admitting: Internal Medicine

## 2013-05-14 NOTE — Telephone Encounter (Signed)
Faxed hardcopy to [facility]  

## 2013-05-14 NOTE — Telephone Encounter (Signed)
Done hardcopy to robin  

## 2013-07-13 ENCOUNTER — Other Ambulatory Visit (INDEPENDENT_AMBULATORY_CARE_PROVIDER_SITE_OTHER): Payer: No Typology Code available for payment source

## 2013-07-13 ENCOUNTER — Telehealth: Payer: Self-pay

## 2013-07-13 DIAGNOSIS — Z0000 Encounter for general adult medical examination without abnormal findings: Secondary | ICD-10-CM

## 2013-07-13 LAB — CBC WITH DIFFERENTIAL/PLATELET
BASOS ABS: 0 10*3/uL (ref 0.0–0.1)
BASOS PCT: 0.3 % (ref 0.0–3.0)
EOS ABS: 0.4 10*3/uL (ref 0.0–0.7)
Eosinophils Relative: 4.6 % (ref 0.0–5.0)
HCT: 47.8 % (ref 39.0–52.0)
Hemoglobin: 16.3 g/dL (ref 13.0–17.0)
LYMPHS PCT: 31.3 % (ref 12.0–46.0)
Lymphs Abs: 2.8 10*3/uL (ref 0.7–4.0)
MCHC: 34.1 g/dL (ref 30.0–36.0)
MCV: 97.7 fl (ref 78.0–100.0)
Monocytes Absolute: 0.9 10*3/uL (ref 0.1–1.0)
Monocytes Relative: 9.9 % (ref 3.0–12.0)
NEUTROS PCT: 53.9 % (ref 43.0–77.0)
Neutro Abs: 4.8 10*3/uL (ref 1.4–7.7)
Platelets: 264 10*3/uL (ref 150.0–400.0)
RBC: 4.89 Mil/uL (ref 4.22–5.81)
RDW: 13.4 % (ref 11.5–15.5)
WBC: 8.9 10*3/uL (ref 4.0–10.5)

## 2013-07-13 LAB — URINALYSIS, ROUTINE W REFLEX MICROSCOPIC
BILIRUBIN URINE: NEGATIVE
Hgb urine dipstick: NEGATIVE
KETONES UR: NEGATIVE
LEUKOCYTES UA: NEGATIVE
Nitrite: NEGATIVE
RBC / HPF: NONE SEEN (ref 0–?)
Specific Gravity, Urine: 1.015 (ref 1.000–1.030)
Total Protein, Urine: NEGATIVE
UROBILINOGEN UA: 0.2 (ref 0.0–1.0)
Urine Glucose: NEGATIVE
pH: 6.5 (ref 5.0–8.0)

## 2013-07-13 LAB — HEPATIC FUNCTION PANEL
ALBUMIN: 3.9 g/dL (ref 3.5–5.2)
ALT: 20 U/L (ref 0–53)
AST: 23 U/L (ref 0–37)
Alkaline Phosphatase: 38 U/L — ABNORMAL LOW (ref 39–117)
Bilirubin, Direct: 0.1 mg/dL (ref 0.0–0.3)
TOTAL PROTEIN: 6.3 g/dL (ref 6.0–8.3)
Total Bilirubin: 0.8 mg/dL (ref 0.2–1.2)

## 2013-07-13 LAB — TSH: TSH: 0.99 u[IU]/mL (ref 0.35–4.50)

## 2013-07-13 LAB — LIPID PANEL
CHOLESTEROL: 199 mg/dL (ref 0–200)
HDL: 47.9 mg/dL (ref >39.00)
LDL Cholesterol: 122 mg/dL — ABNORMAL HIGH (ref 0–99)
NonHDL: 151.1
Total CHOL/HDL Ratio: 4
Triglycerides: 146 mg/dL (ref 0.0–149.0)
VLDL: 29.2 mg/dL (ref 0.0–40.0)

## 2013-07-13 LAB — BASIC METABOLIC PANEL
BUN: 12 mg/dL (ref 6–23)
CHLORIDE: 106 meq/L (ref 96–112)
CO2: 27 meq/L (ref 19–32)
Calcium: 9.2 mg/dL (ref 8.4–10.5)
Creatinine, Ser: 1 mg/dL (ref 0.4–1.5)
GFR: 86.5 mL/min (ref >60.00)
GLUCOSE: 74 mg/dL (ref 70–99)
Potassium: 4.6 mEq/L (ref 3.5–5.1)
SODIUM: 141 meq/L (ref 135–145)

## 2013-07-13 LAB — PSA: PSA: 3 ng/mL (ref 0.10–4.00)

## 2013-07-13 NOTE — Telephone Encounter (Signed)
Labs entered.

## 2013-07-15 ENCOUNTER — Ambulatory Visit (INDEPENDENT_AMBULATORY_CARE_PROVIDER_SITE_OTHER): Payer: No Typology Code available for payment source | Admitting: Internal Medicine

## 2013-07-15 ENCOUNTER — Encounter: Payer: Self-pay | Admitting: Internal Medicine

## 2013-07-15 VITALS — BP 140/90 | HR 94 | Temp 98.3°F | Ht 68.0 in | Wt 166.2 lb

## 2013-07-15 DIAGNOSIS — M545 Low back pain, unspecified: Secondary | ICD-10-CM

## 2013-07-15 DIAGNOSIS — R41840 Attention and concentration deficit: Secondary | ICD-10-CM

## 2013-07-15 DIAGNOSIS — R972 Elevated prostate specific antigen [PSA]: Secondary | ICD-10-CM | POA: Insufficient documentation

## 2013-07-15 DIAGNOSIS — Z0000 Encounter for general adult medical examination without abnormal findings: Secondary | ICD-10-CM

## 2013-07-15 MED ORDER — TRAMADOL HCL 50 MG PO TABS: 50.0000 mg | ORAL_TABLET | Freq: Three times a day (TID) | ORAL | Status: DC | PRN

## 2013-07-15 NOTE — Assessment & Plan Note (Signed)
For psychology referral, consider adderall, prior add meds not worked well for pt, needs dx confirmation

## 2013-07-15 NOTE — Progress Notes (Signed)
Pre visit review using our clinic review tool, if applicable. No additional management support is needed unless otherwise documented below in the visit note. 

## 2013-07-15 NOTE — Assessment & Plan Note (Signed)
Marked incr velocity over 1 yr, but with recent prostatitis, for f/u psa at 6 mo, consider refer back to urology if persistent elev or worsening

## 2013-07-15 NOTE — Assessment & Plan Note (Signed)

## 2013-07-15 NOTE — Progress Notes (Signed)
Subjective:    Patient ID: Scott Walker, male    DOB: 1960-04-08, 53 y.o.   MRN: [id number]  HPI  Here for wellness and f/u;  Overall doing ok;  Pt denies CP, worsening SOB, DOE, wheezing, orthopnea, PND, worsening LE edema, palpitations, dizziness or syncope.  Pt denies neurological change such as new headache, facial or extremity weakness.  Pt denies polydipsia, polyuria, or low sugar symptoms. Pt states overall good compliance with treatment and medications, good tolerability, and has been trying to follow lower cholesterol diet.  Pt denies worsening depressive symptoms, suicidal ideation or panic. No fever, night sweats, wt loss, loss of appetite, or other constitutional symptoms.  Pt states good ability with ADL's, has low fall risk, home safety reviewed and adequate, no other significant changes in hearing or vision, and trying to stay regularly active with exercise.  Does have ongoing difficulty with concentration, took a sister's adderall and did well for several hours.  Did have recent prostatitis just finished 2 rounds of antibx total 6 wks per urology.  Pt continues to have recurring LBP without change in severity, bowel or bladder change, fever, wt loss,  worsening LE pain/numbness/weakness, gait change or falls.   Past Medical History  Diagnosis Date  . HYPERLIPIDEMIA 11/18/2007  . ADD 02/24/2008  . SINUSITIS- ACUTE-NOS 12/28/2008  . ALLERGIC RHINITIS 11/18/2007  . BENIGN PROSTATIC HYPERTROPHY 11/18/2007  . PROSTATITIS, ACUTE 02/24/2008  . LOW BACK PAIN 11/18/2007  . ARM PAIN, LEFT 02/24/2008  . INSOMNIA-SLEEP DISORDER-UNSPEC 10/25/2009  . Routine general medical examination at a health care facility 04/26/2010  . Special screening for malignant neoplasms, ovary 04/26/2010   Past Surgical History  Procedure Laterality Date  . Vasectomy      reports that he has been smoking Cigars.  He does not have any smokeless tobacco history on file. He reports that he drinks alcohol. His  drug history is not on file. family history includes Hyperlipidemia in an other family member. Allergies  Allergen Reactions  . Statins    Current Outpatient Prescriptions on File Prior to Visit  Medication Sig Dispense Refill  . aspirin 81 MG EC tablet Take 81 mg by mouth daily.        . finasteride (PROSCAR) 5 MG tablet Take one tablet by mouth one time daily  30 tablet  6  . zolpidem (AMBIEN) 10 MG tablet TAKE ONE TABLET NIGHTLY AT BEDTIME AS NEEDED SLEEP   30 tablet  0   No current facility-administered medications on file prior to visit.   Review of Systems Constitutional: Negative for increased diaphoresis, other activity, appetite or other siginficant weight change  HENT: Negative for worsening hearing loss, ear pain, facial swelling, mouth sores and neck stiffness.   Eyes: Negative for other worsening pain, redness or visual disturbance.  Respiratory: Negative for shortness of breath and wheezing.   Cardiovascular: Negative for chest pain and palpitations.  Gastrointestinal: Negative for diarrhea, blood in stool, abdominal distention or other pain Genitourinary: Negative for hematuria, flank pain or change in urine volume.  Musculoskeletal: Negative for myalgias or other joint complaints.  Skin: Negative for color change and wound.  Neurological: Negative for syncope and numbness. other than noted Hematological: Negative for adenopathy. or other swelling Psychiatric/Behavioral: Negative for hallucinations, self-injury, decreased concentration or other worsening agitation.      Objective:   Physical Exam BP 140/90  Pulse 94  Temp(Src) 98.3 F (36.8 C) (Oral)  Ht 5\' 8"  (1.727 m)  Wt 166 lb  4 oz (75.411 kg)  BMI 25.28 kg/m2  SpO2 96% VS noted,  Constitutional: Pt is oriented to person, place, and time. Appears well-developed and well-nourished.  Head: Normocephalic and atraumatic.  Right Ear: External ear normal.  Left Ear: External ear normal.  Nose: Nose normal.    Mouth/Throat: Oropharynx is clear and moist.  Eyes: Conjunctivae and EOM are normal. Pupils are equal, round, and reactive to light.  Neck: Normal range of motion. Neck supple. No JVD present. No tracheal deviation present.  Cardiovascular: Normal rate, regular rhythm, normal heart sounds and intact distal pulses.   Pulmonary/Chest: Effort normal and breath sounds without rales or wheezing  Abdominal: Soft. Bowel sounds are normal. NT. No HSM  Musculoskeletal: Normal range of motion. Exhibits no edema.  Lymphadenopathy:  Has no cervical adenopathy.  Neurological: Pt is alert and oriented to person, place, and time. Pt has normal reflexes. No cranial nerve deficit. Motor grossly intact Skin: Skin is warm and dry. No rash noted.  Psychiatric:  Has normal mood and affect. Behavior is normal.  Spine nontender    Assessment & Plan:

## 2013-07-15 NOTE — Assessment & Plan Note (Signed)
Chronic recurrent, take tramadol 1-2 per wk prn,  to f/u any worsening symptoms or concerns

## 2013-07-15 NOTE — Patient Instructions (Addendum)
Please take all new medication as prescribed - the detrol LA 4 mg per day  Please call at 4 wks if this helps for a prescription  Please continue all other medications as before, and refills have been done if requested. - the tramadol  Please have the pharmacy call with any other refills you may need.  Please continue your efforts at being more active, low cholesterol diet, and weight control.  You are otherwise up to date with prevention measures today.  Please keep your appointments with your specialists as you may have planned  Call if you would like the referral for colonoscopy  Please return in 6 mo for LAB only - the PSA  You will be contacted regarding the referral for: ADD evaluation  Please remember to sign up for MyChart if you have not done so, as this will be important to you in the future with finding out test results, communicating by private email, and scheduling acute appointments online when needed.  Please return in 1 year for your yearly visit, or sooner if needed, with Lab testing done 3-5 days before

## 2013-07-16 ENCOUNTER — Telehealth: Payer: Self-pay | Admitting: Internal Medicine

## 2013-07-16 NOTE — Telephone Encounter (Signed)
Relevant patient education assigned to patient using Emmi. ° °

## 2013-07-19 ENCOUNTER — Other Ambulatory Visit: Payer: Self-pay | Admitting: Internal Medicine

## 2013-07-20 NOTE — Telephone Encounter (Signed)
Faxed hardcopy to [facility].

## 2013-07-20 NOTE — Telephone Encounter (Signed)
Done hardcopy to robin  

## 2013-09-01 ENCOUNTER — Ambulatory Visit: Payer: No Typology Code available for payment source | Admitting: Psychology

## 2014-01-15 ENCOUNTER — Other Ambulatory Visit: Payer: Self-pay | Admitting: Internal Medicine

## 2014-01-17 MED ORDER — ZOLPIDEM TARTRATE 10 MG PO TABS: ORAL_TABLET | ORAL | Status: DC

## 2014-01-17 NOTE — Telephone Encounter (Signed)
Done hardcopy to robin  

## 2014-01-18 ENCOUNTER — Other Ambulatory Visit: Payer: Self-pay | Admitting: Internal Medicine

## 2014-01-18 NOTE — Telephone Encounter (Signed)
Faxed hardcopy for zolpidem to [facility] [place]

## 2014-06-15 ENCOUNTER — Other Ambulatory Visit: Payer: Self-pay | Admitting: Internal Medicine

## 2014-06-15 NOTE — Telephone Encounter (Signed)
Done hardcopy to Cherina  

## 2014-06-15 NOTE — Telephone Encounter (Signed)
Rx sent to pharmacy   

## 2014-07-15 ENCOUNTER — Other Ambulatory Visit: Payer: Self-pay | Admitting: Internal Medicine

## 2014-07-18 ENCOUNTER — Telehealth: Payer: Self-pay | Admitting: Internal Medicine

## 2014-07-18 NOTE — Telephone Encounter (Signed)
Patient need a refill of his ambiem 10 mg [facility]

## 2014-07-19 MED ORDER — ZOLPIDEM TARTRATE 10 MG PO TABS: ORAL_TABLET | ORAL | Status: DC

## 2014-07-19 NOTE — Telephone Encounter (Signed)
Rx faxed to pharmacy  

## 2014-09-16 ENCOUNTER — Other Ambulatory Visit: Payer: Self-pay | Admitting: Internal Medicine

## 2014-09-20 ENCOUNTER — Telehealth: Payer: Self-pay | Admitting: Internal Medicine

## 2014-09-20 MED ORDER — ZOLPIDEM TARTRATE 10 MG PO TABS: ORAL_TABLET | ORAL | Status: DC

## 2014-09-20 NOTE — Telephone Encounter (Signed)
Rx faxed to pharmacy  

## 2014-09-20 NOTE — Telephone Encounter (Signed)
Done hardcopy to Dahlia  

## 2014-09-20 NOTE — Telephone Encounter (Signed)
Pt callled in said that he is out of the zolpidem (AMBIEN) 10 MG tablet [[id number].  He wanted to know if he could get enough refill to make it to 9/7 (his cpe appt)???     [facility] [address]

## 2014-10-12 ENCOUNTER — Other Ambulatory Visit (INDEPENDENT_AMBULATORY_CARE_PROVIDER_SITE_OTHER): Payer: No Typology Code available for payment source

## 2014-10-12 ENCOUNTER — Encounter: Payer: Self-pay | Admitting: Internal Medicine

## 2014-10-12 ENCOUNTER — Telehealth: Payer: Self-pay | Admitting: Internal Medicine

## 2014-10-12 ENCOUNTER — Other Ambulatory Visit: Payer: No Typology Code available for payment source

## 2014-10-12 ENCOUNTER — Ambulatory Visit (INDEPENDENT_AMBULATORY_CARE_PROVIDER_SITE_OTHER): Payer: No Typology Code available for payment source | Admitting: Internal Medicine

## 2014-10-12 VITALS — BP 116/74 | HR 67 | Temp 97.8°F | Ht 68.0 in | Wt 161.0 lb

## 2014-10-12 DIAGNOSIS — Z0000 Encounter for general adult medical examination without abnormal findings: Secondary | ICD-10-CM

## 2014-10-12 LAB — LIPID PANEL
CHOLESTEROL: 190 mg/dL (ref 0–200)
HDL: 45.9 mg/dL (ref >39.00)
LDL CALC: 116 mg/dL — AB (ref 0–99)
NonHDL: 143.81
TRIGLYCERIDES: 137 mg/dL (ref 0.0–149.0)
Total CHOL/HDL Ratio: 4
VLDL: 27.4 mg/dL (ref 0.0–40.0)

## 2014-10-12 LAB — URINALYSIS, ROUTINE W REFLEX MICROSCOPIC
BILIRUBIN URINE: NEGATIVE
HGB URINE DIPSTICK: NEGATIVE
KETONES UR: NEGATIVE
LEUKOCYTES UA: NEGATIVE
NITRITE: NEGATIVE
RBC / HPF: NONE SEEN (ref 0–?)
Specific Gravity, Urine: <=1.005 — AB (ref 1.000–1.030)
Total Protein, Urine: NEGATIVE
URINE GLUCOSE: NEGATIVE
UROBILINOGEN UA: 0.2 (ref 0.0–1.0)
WBC UA: NONE SEEN (ref 0–?)
pH: 7 (ref 5.0–8.0)

## 2014-10-12 LAB — BASIC METABOLIC PANEL
BUN: 9 mg/dL (ref 6–23)
CO2: 27 mEq/L (ref 19–32)
Calcium: 9.3 mg/dL (ref 8.4–10.5)
Chloride: 106 mEq/L (ref 96–112)
Creatinine, Ser: 0.94 mg/dL (ref 0.40–1.50)
GFR: 89.26 mL/min (ref >60.00)
Glucose, Bld: 89 mg/dL (ref 70–99)
POTASSIUM: 4.2 meq/L (ref 3.5–5.1)
SODIUM: 140 meq/L (ref 135–145)

## 2014-10-12 LAB — CBC WITH DIFFERENTIAL/PLATELET
Basophils Absolute: 0 10*3/uL (ref 0.0–0.1)
Basophils Relative: 0.4 % (ref 0.0–3.0)
Eosinophils Absolute: 0.3 10*3/uL (ref 0.0–0.7)
Eosinophils Relative: 4.2 % (ref 0.0–5.0)
HCT: 44.2 % (ref 39.0–52.0)
Hemoglobin: 15.1 g/dL (ref 13.0–17.0)
Lymphocytes Relative: 30.9 % (ref 12.0–46.0)
Lymphs Abs: 2.4 10*3/uL (ref 0.7–4.0)
MCHC: 34.2 g/dL (ref 30.0–36.0)
MCV: 97 fl (ref 78.0–100.0)
Monocytes Absolute: 0.7 10*3/uL (ref 0.1–1.0)
Monocytes Relative: 8.6 % (ref 3.0–12.0)
Neutro Abs: 4.4 10*3/uL (ref 1.4–7.7)
Neutrophils Relative %: 55.9 % (ref 43.0–77.0)
Platelets: 294 10*3/uL (ref 150.0–400.0)
RBC: 4.55 Mil/uL (ref 4.22–5.81)
RDW: 13.1 % (ref 11.5–15.5)
WBC: 7.9 10*3/uL (ref 4.0–10.5)

## 2014-10-12 LAB — HEPATIC FUNCTION PANEL
ALBUMIN: 4.3 g/dL (ref 3.5–5.2)
ALT: 12 U/L (ref 0–53)
AST: 14 U/L (ref 0–37)
Alkaline Phosphatase: 44 U/L (ref 39–117)
Bilirubin, Direct: 0.1 mg/dL (ref 0.0–0.3)
TOTAL PROTEIN: 6.6 g/dL (ref 6.0–8.3)
Total Bilirubin: 0.6 mg/dL (ref 0.2–1.2)

## 2014-10-12 LAB — TSH: TSH: 0.59 u[IU]/mL (ref 0.35–4.50)

## 2014-10-12 LAB — PSA: PSA: 0.43 ng/mL (ref 0.10–4.00)

## 2014-10-12 MED ORDER — FINASTERIDE 5 MG PO TABS: 5.0000 mg | ORAL_TABLET | Freq: Every day | ORAL | Status: DC

## 2014-10-12 NOTE — Telephone Encounter (Signed)
Labs ordered, please advise pt of same.Thanks!

## 2014-10-12 NOTE — Progress Notes (Signed)
Pre visit review using our clinic review tool, if applicable. No additional management support is needed unless otherwise documented below in the visit note. 

## 2014-10-12 NOTE — Progress Notes (Signed)
Subjective:    Patient ID: Scott Walker, male    DOB: 08/13/1960, 54 y.o.   MRN: [id number]  HPI  Here for wellness and f/u;  Overall doing ok;  Pt denies Chest pain, worsening SOB, DOE, wheezing, orthopnea, PND, worsening LE edema, palpitations, dizziness or syncope.  Pt denies neurological change such as new headache, facial or extremity weakness.  Pt denies polydipsia, polyuria, or low sugar symptoms. Pt states overall good compliance with treatment and medications, good tolerability, and has been trying to follow appropriate diet.  Pt denies worsening depressive symptoms, suicidal ideation or panic. No fever, night sweats, wt loss, loss of appetite, or other constitutional symptoms.  Pt states good ability with ADL's, has low fall risk, home safety reviewed and adequate, no other significant changes in hearing or vision, and occasionally active with exercise. Wt Readings from Last 3 Encounters:  10/12/14 161 lb (73.029 kg)  07/15/13 166 lb 4 oz (75.411 kg)  04/13/12 171 lb 8 oz (77.792 kg)   Past Medical History  Diagnosis Date  . HYPERLIPIDEMIA 11/18/2007  . ADD 02/24/2008  . SINUSITIS- ACUTE-NOS 12/28/2008  . ALLERGIC RHINITIS 11/18/2007  . BENIGN PROSTATIC HYPERTROPHY 11/18/2007  . PROSTATITIS, ACUTE 02/24/2008  . LOW BACK PAIN 11/18/2007  . ARM PAIN, LEFT 02/24/2008  . INSOMNIA-SLEEP DISORDER-UNSPEC 10/25/2009  . Routine general medical examination at a health care facility 04/26/2010  . Special screening for malignant neoplasms, ovary 04/26/2010   Past Surgical History  Procedure Laterality Date  . Vasectomy      reports that he has been smoking Cigars.  He does not have any smokeless tobacco history on file. He reports that he drinks alcohol. His drug history is not on file. family history includes Hyperlipidemia in an other family member. Allergies  Allergen Reactions  . Statins    Review of Systems Constitutional: Negative for increased diaphoresis, other activity,  appetite or siginficant weight change other than noted HENT: Negative for worsening hearing loss, ear pain, facial swelling, mouth sores and neck stiffness.   Eyes: Negative for other worsening pain, redness or visual disturbance.  Respiratory: Negative for shortness of breath and wheezing  Cardiovascular: Negative for chest pain and palpitations.  Gastrointestinal: Negative for diarrhea, blood in stool, abdominal distention or other pain Genitourinary: Negative for hematuria, flank pain or change in urine volume.  Musculoskeletal: Negative for myalgias or other joint complaints.  Skin: Negative for color change and wound or drainage.  Neurological: Negative for syncope and numbness. other than noted Hematological: Negative for adenopathy. or other swelling Psychiatric/Behavioral: Negative for hallucinations, SI, self-injury, decreased concentration or other worsening agitation.      Objective:   Physical Exam BP 116/74 mmHg  Pulse 67  Temp(Src) 97.8 F (36.6 C) (Oral)  Ht 5\' 8"  (1.727 m)  Wt 161 lb (73.029 kg)  BMI 24.49 kg/m2  SpO2 97% VS noted,  Constitutional: Pt is oriented to person, place, and time. Appears well-developed and well-nourished, in no significant distress Head: Normocephalic and atraumatic.  Right Ear: External ear normal.  Left Ear: External ear normal.  Nose: Nose normal.  Mouth/Throat: Oropharynx is clear and moist.  Eyes: Conjunctivae and EOM are normal. Pupils are equal, round, and reactive to light.  Neck: Normal range of motion. Neck supple. No JVD present. No tracheal deviation present or significant neck LA or mass Cardiovascular: Normal rate, regular rhythm, normal heart sounds and intact distal pulses.   Pulmonary/Chest: Effort normal and breath sounds without rales or wheezing  Abdominal: Soft. Bowel sounds are normal. NT. No HSM  Musculoskeletal: Normal range of motion. Exhibits no edema.  Lymphadenopathy:  Has no cervical adenopathy.    Neurological: Pt is alert and oriented to person, place, and time. Pt has normal reflexes. No cranial nerve deficit. Motor grossly intact Skin: Skin is warm and dry. No rash noted.  Psychiatric:  Has normal mood and affect. Behavior is normal.     Assessment & Plan:

## 2014-10-12 NOTE — Assessment & Plan Note (Addendum)

## 2014-10-12 NOTE — Patient Instructions (Addendum)
Please continue all other medications as before, and refills have been done if requested.  Please have the pharmacy call with any other refills you may need.  Please continue your efforts at being more active, low cholesterol diet, and weight control.  You are otherwise up to date with prevention measures today.  Please keep your appointments with your specialists as you may have planned  You will be contacted regarding the referral for: colonoscopy  We will let you know about the lab work  You will be contacted by phone if any changes need to be made immediately.  Otherwise, you will receive a letter about your results with an explanation, but please check with MyChart first.  Please remember to sign up for MyChart if you have not done so, as this will be important to you in the future with finding out test results, communicating by private email, and scheduling acute appointments online when needed.  Please return in 1 year for your yearly visit, or sooner if needed, with Lab testing done 3-5 days before

## 2014-10-12 NOTE — Telephone Encounter (Signed)
Pt called in and wanted to come in this morning to get labs done before his appt.  Can the labs be put in for cpe?

## 2014-10-13 LAB — HEPATITIS C ANTIBODY: HCV Ab: NEGATIVE

## 2014-10-19 ENCOUNTER — Other Ambulatory Visit: Payer: Self-pay | Admitting: Internal Medicine

## 2014-10-19 NOTE — Telephone Encounter (Signed)
Rx faxed to pharmacy  

## 2014-10-19 NOTE — Telephone Encounter (Signed)
Done hardcopy to Dahlia  

## 2015-01-05 ENCOUNTER — Encounter: Payer: Self-pay | Admitting: Internal Medicine

## 2015-02-20 ENCOUNTER — Ambulatory Visit (AMBULATORY_SURGERY_CENTER): Payer: Self-pay | Admitting: *Deleted

## 2015-02-20 VITALS — Ht 68.0 in | Wt 167.4 lb

## 2015-02-20 DIAGNOSIS — Z1211 Encounter for screening for malignant neoplasm of colon: Secondary | ICD-10-CM

## 2015-02-20 NOTE — Progress Notes (Signed)
No egg or soy allergy known to patient  No issues with past sedation with any surgeries  or procedures, no intubation problems  No diet pills per patient  No home 02 use per patient  Pt concerned about not having a care partner . Instructed multiple times he must have a care partner to stay the whole 2-3 hours . He verbalized understanding  emmi declined

## 2015-03-06 ENCOUNTER — Ambulatory Visit (AMBULATORY_SURGERY_CENTER): Payer: BLUE CROSS/BLUE SHIELD | Admitting: Internal Medicine

## 2015-03-06 ENCOUNTER — Encounter: Payer: Self-pay | Admitting: Internal Medicine

## 2015-03-06 VITALS — BP 127/90 | HR 69 | Temp 97.6°F | Resp 11 | Ht 68.0 in | Wt 161.0 lb

## 2015-03-06 DIAGNOSIS — Z1211 Encounter for screening for malignant neoplasm of colon: Secondary | ICD-10-CM | POA: Diagnosis not present

## 2015-03-06 MED ORDER — SODIUM CHLORIDE 0.9 % IV SOLN: 500.0000 mL | INTRAVENOUS | Status: DC

## 2015-03-06 NOTE — Patient Instructions (Signed)
YOU HAD AN ENDOSCOPIC PROCEDURE TODAY AT THE [facility]:   Refer to the procedure report that was given to you for any specific questions about what was found during the examination.  If the procedure report does not answer your questions, please call your gastroenterologist to clarify.  If you requested that your care partner not be given the details of your procedure findings, then the procedure report has been included in a sealed envelope for you to review at your convenience later.  YOU SHOULD EXPECT: Some feelings of bloating in the abdomen. Passage of more gas than usual.  Walking can help get rid of the air that was put into your GI tract during the procedure and reduce the bloating. If you had a lower endoscopy (such as a colonoscopy or flexible sigmoidoscopy) you may notice spotting of blood in your stool or on the toilet paper. If you underwent a bowel prep for your procedure, you may not have a normal bowel movement for a few days.  Please Note:  You might notice some irritation and congestion in your nose or some drainage.  This is from the oxygen used during your procedure.  There is no need for concern and it should clear up in a day or so.  SYMPTOMS TO REPORT IMMEDIATELY:   Following lower endoscopy (colonoscopy or flexible sigmoidoscopy):  Excessive amounts of blood in the stool  Significant tenderness or worsening of abdominal pains  Swelling of the abdomen that is new, acute  Fever of 100F or higher   For urgent or emergent issues, a gastroenterologist can be reached at any hour by calling [phone].   DIET: Your first meal following the procedure should be a small meal and then it is ok to progress to your normal diet. Heavy or fried foods are harder to digest and may make you feel nauseous or bloated.  Likewise, meals heavy in dairy and vegetables can increase bloating.  Drink plenty of fluids but you should avoid alcoholic beverages for 24  hours.  ACTIVITY:  You should plan to take it easy for the rest of today and you should NOT DRIVE or use heavy machinery until tomorrow (because of the sedation medicines used during the test).    FOLLOW UP: Our staff will call the number listed on your records the next business day following your procedure to check on you and address any questions or concerns that you may have regarding the information given to you following your procedure. If we do not reach you, we will leave a message.  However, if you are feeling well and you are not experiencing any problems, there is no need to return our call.  We will assume that you have returned to your regular daily activities without incident.  If any biopsies were taken you will be contacted by phone or by letter within the next 1-3 weeks.  Please call us at [phone] if you have not heard about the biopsies in 3 weeks.    SIGNATURES/CONFIDENTIALITY: You and/or your care partner have signed paperwork which will be entered into your electronic medical record.  These signatures attest to the fact that that the information above on your After Visit Summary has been reviewed and is understood.  Full responsibility of the confidentiality of this discharge information lies with you and/or your care-partner.  Normal exam  Repeat colonoscopy in 10 years-2027. 

## 2015-03-06 NOTE — Progress Notes (Signed)
A/ox3 pleased with MAC, report to April RN 

## 2015-03-06 NOTE — Op Note (Signed)
[place]  [address] [place], [id number]   COLONOSCOPY PROCEDURE REPORT  PATIENT: Scott Walker, Scott Walker  MR#: [id number] BIRTHDATE: 20-Jan-1961 , 58  yrs. old GENDER: male ENDOSCOPIST: Jerene Bears, MD REFERRED [id number] John, M.D. PROCEDURE DATE:  03/06/2015 PROCEDURE:   Colonoscopy, screening First Screening Colonoscopy - Avg.  risk and is 50 yrs.  old or older Yes.  Prior Negative Screening - Now for repeat screening. N/A  History of Adenoma - Now for follow-up colonoscopy & has been > or = to 3 yrs.  N/A  Recommend repeat exam, <10 yrs? No Polyps removed today? No ASA CLASS:   Class II INDICATIONS:Screening for colonic neoplasia and Colorectal Neoplasm Risk Assessment for this procedure is average risk. MEDICATIONS: Monitored anesthesia care and Propofol 400 mg IV  DESCRIPTION OF PROCEDURE:   After the risks benefits and alternatives of the procedure were thoroughly explained, informed consent was obtained.  The digital rectal exam revealed no rectal mass.   The LB [id number]  endoscope was introduced through the anus and advanced to the cecum, which was identified by both the appendix and ileocecal valve. No adverse events experienced. The quality of the prep was good.  (Suprep was used)  The instrument was then slowly withdrawn as the colon was fully examined. Estimated blood loss is zero unless otherwise noted in this procedure report.    COLON FINDINGS: There was moderate diverticulosis noted in the ascending colon and left colon.   The examination was otherwise normal.  Retroflexed views revealed internal hemorrhoids. The time to cecum = 4.5 Withdrawal time = 10.9   The scope was withdrawn and the procedure completed.  COMPLICATIONS: There were no immediate complications.  ENDOSCOPIC IMPRESSION: 1.   Moderate diverticulosis was noted in the ascending colon and left colon 2.   The examination was otherwise normal  RECOMMENDATIONS: 1.   High fiber diet 2.  You should continue to follow colorectal cancer screening guidelines for "routine risk" patients with a repeat colonoscopy in 10 years.  There is no need for FOBT (stool) testing for at least 5 years.  eSigned:  Jerene Bears, MD 03/06/2015 12:56 PM   cc:  the patient, Dr. [name]

## 2015-03-07 ENCOUNTER — Telehealth: Payer: Self-pay

## 2015-03-07 NOTE — Telephone Encounter (Signed)
  Follow up Call-  Call back number 03/06/2015  Post procedure Call Back phone  # [phone]  Permission to leave phone message Yes     Patient questions:  Do you have a fever, pain , or abdominal swelling? No. Pain Score  0 *  Have you tolerated food without any problems? Yes.    Have you been able to return to your normal activities? Yes.    Do you have any questions about your discharge instructions: Diet   No. Medications  No. Follow up visit  No.  Do you have questions or concerns about your Care? No.  Actions: * If pain score is 4 or above: No action needed, pain <4.

## 2015-03-15 ENCOUNTER — Other Ambulatory Visit: Payer: Self-pay | Admitting: Internal Medicine

## 2015-03-15 NOTE — Telephone Encounter (Signed)
Scott Walker too soon  Pt should have total 6 mo tx from sept 14, which means no refill should be due by my calculation until mar 2017

## 2015-03-15 NOTE — Telephone Encounter (Signed)
Please advise, patient needs refill  

## 2015-04-04 ENCOUNTER — Other Ambulatory Visit: Payer: Self-pay

## 2015-04-04 ENCOUNTER — Other Ambulatory Visit: Payer: Self-pay | Admitting: Internal Medicine

## 2015-04-17 ENCOUNTER — Telehealth: Payer: Self-pay | Admitting: Internal Medicine

## 2015-04-18 ENCOUNTER — Other Ambulatory Visit: Payer: Self-pay | Admitting: Internal Medicine

## 2015-04-18 NOTE — Telephone Encounter (Signed)
Please advise, ok to refill? 

## 2015-04-18 NOTE — Telephone Encounter (Signed)
Pharmacy has called stating they haven't received this prescription yet.

## 2015-04-18 NOTE — Telephone Encounter (Signed)
Done hardcopy to Corinne  

## 2015-04-18 NOTE — Telephone Encounter (Signed)
rx already done earlier today

## 2015-04-19 NOTE — Telephone Encounter (Signed)
Already spoke to pharmacy they have it on file

## 2015-04-19 NOTE — Telephone Encounter (Signed)
Corrine Pls contact pharmacy MD gave rx to you...Johny Chess

## 2015-06-14 ENCOUNTER — Other Ambulatory Visit: Payer: Self-pay

## 2015-06-14 MED ORDER — TRAMADOL HCL 50 MG PO TABS: 50.0000 mg | ORAL_TABLET | Freq: Three times a day (TID) | ORAL | Status: DC | PRN

## 2015-06-14 NOTE — Telephone Encounter (Signed)
Done hardcopy to Corinne  

## 2015-06-14 NOTE — Telephone Encounter (Signed)
Pt called rq rf of Tramadol. Please send to [facility]. Please contact pt with approval or denial.

## 2015-06-14 NOTE — Telephone Encounter (Signed)
Please advise 

## 2015-06-14 NOTE — Telephone Encounter (Signed)
Medication faxed

## 2015-07-14 ENCOUNTER — Other Ambulatory Visit: Payer: Self-pay | Admitting: Internal Medicine

## 2015-07-14 NOTE — Telephone Encounter (Signed)
Please advise in dr john's absence, thanks 

## 2015-07-14 NOTE — Telephone Encounter (Signed)
rx at front office, patient will need UDS at pick up----this info has been written on rx envelope

## 2015-07-14 NOTE — Telephone Encounter (Signed)
Printed and signed, recommend UDS at pickup as none on file and 2 controlled substances.

## 2015-09-12 ENCOUNTER — Telehealth: Payer: Self-pay | Admitting: Internal Medicine

## 2015-09-12 NOTE — Telephone Encounter (Signed)
Patient never came to pick up RX for tramadol dated 07/14/2015*UDS REQUESTED*  Disposing of it

## 2015-10-27 ENCOUNTER — Other Ambulatory Visit: Payer: Self-pay | Admitting: Internal Medicine

## 2015-10-30 NOTE — Telephone Encounter (Signed)
zolpidem (AMBIEN) 10 MG tablet   Patient is requesting a refill on this medication. Thank you.

## 2015-10-31 NOTE — Telephone Encounter (Signed)
Done hardcopy to Corinne  Please help pt make ROV as he is due for yearly

## 2015-10-31 NOTE — Telephone Encounter (Signed)
faxed

## 2015-11-28 ENCOUNTER — Other Ambulatory Visit: Payer: Self-pay | Admitting: Internal Medicine

## 2015-11-28 ENCOUNTER — Other Ambulatory Visit: Payer: Self-pay | Admitting: *Deleted

## 2015-11-28 MED ORDER — FINASTERIDE 5 MG PO TABS: 5.0000 mg | ORAL_TABLET | Freq: Every day | ORAL | 0 refills | Status: DC

## 2015-11-28 NOTE — Telephone Encounter (Signed)
Done hardcopy to Corinne  

## 2015-11-28 NOTE — Telephone Encounter (Signed)
faxed

## 2015-11-29 ENCOUNTER — Other Ambulatory Visit: Payer: Self-pay | Admitting: Internal Medicine

## 2015-11-29 NOTE — Telephone Encounter (Signed)
faxed

## 2015-12-01 ENCOUNTER — Telehealth: Payer: Self-pay | Admitting: Emergency Medicine

## 2015-12-01 MED ORDER — TRAMADOL HCL 50 MG PO TABS: 50.0000 mg | ORAL_TABLET | Freq: Three times a day (TID) | ORAL | 1 refills | Status: DC | PRN

## 2015-12-01 MED ORDER — ZOLPIDEM TARTRATE 10 MG PO TABS: ORAL_TABLET | ORAL | 5 refills | Status: DC

## 2015-12-01 NOTE — Telephone Encounter (Signed)
Done hardcopy to Corinne  

## 2015-12-01 NOTE — Telephone Encounter (Signed)
Pharmacy called and can not take transfer prescription so they are going to need new prescriptions written for zolpidem (AMBIEN) 10 MG tablet and traMADol (ULTRAM) 50 MG tablet. Please advise thanks.

## 2016-03-12 ENCOUNTER — Other Ambulatory Visit: Payer: Self-pay | Admitting: Internal Medicine

## 2016-04-22 ENCOUNTER — Other Ambulatory Visit: Payer: Self-pay | Admitting: Internal Medicine

## 2016-04-23 NOTE — Telephone Encounter (Signed)
/  Done hardcopy to Shirron  Please let pt know that I could only do 1 month without refills, and can do no further future refills, as he would need ROV since was last seen 18 mo ago

## 2016-04-23 NOTE — Telephone Encounter (Signed)
Called pt no answer LMOM w/MD response rx has been faxed to [facility].Marland KitchenJohny Walker

## 2016-05-22 ENCOUNTER — Other Ambulatory Visit: Payer: Self-pay | Admitting: Internal Medicine

## 2016-05-22 NOTE — Telephone Encounter (Signed)
Unfortunately we are unable to refill the proscar due to last visit > 15 mo (office policy)

## 2016-05-22 NOTE — Telephone Encounter (Signed)
Can you call patient and inform him medication was denied due to him not being seen in over a year. Please schedule an appt if he would like to continue getting medication. Thank!

## 2016-05-22 NOTE — Telephone Encounter (Signed)
Pt has CPE scheduled on 06/07/16.

## 2016-05-23 ENCOUNTER — Telehealth: Payer: Self-pay | Admitting: Internal Medicine

## 2016-05-23 ENCOUNTER — Encounter: Payer: BLUE CROSS/BLUE SHIELD | Admitting: Internal Medicine

## 2016-05-23 NOTE — Telephone Encounter (Signed)
Yes that's fine if one is available when he calls back.

## 2016-05-23 NOTE — Telephone Encounter (Signed)
LVM to inform Scott Walker about denial for medication, [name] has an appt tomorrow at 8:30 for CPE, called to inform Scott Walker of the appt. If he cannot come in tomorrw will you allow me to put Scott Walker a 30 minute slot for CP to get in sooner

## 2016-06-07 ENCOUNTER — Encounter: Payer: BLUE CROSS/BLUE SHIELD | Admitting: Internal Medicine

## 2018-10-16 DIAGNOSIS — R030 Elevated blood-pressure reading, without diagnosis of hypertension: Secondary | ICD-10-CM | POA: Insufficient documentation

## 2018-10-17 DIAGNOSIS — M5136 Other intervertebral disc degeneration, lumbar region: Secondary | ICD-10-CM | POA: Insufficient documentation

## 2018-10-17 DIAGNOSIS — M4310 Spondylolisthesis, site unspecified: Secondary | ICD-10-CM | POA: Insufficient documentation

## 2018-10-17 DIAGNOSIS — M51369 Other intervertebral disc degeneration, lumbar region without mention of lumbar back pain or lower extremity pain: Secondary | ICD-10-CM | POA: Insufficient documentation

## 2019-04-05 DIAGNOSIS — R2231 Localized swelling, mass and lump, right upper limb: Secondary | ICD-10-CM | POA: Insufficient documentation

## 2019-04-05 DIAGNOSIS — M79601 Pain in right arm: Secondary | ICD-10-CM | POA: Insufficient documentation

## 2019-04-08 DIAGNOSIS — D1721 Benign lipomatous neoplasm of skin and subcutaneous tissue of right arm: Secondary | ICD-10-CM | POA: Insufficient documentation

## 2019-12-10 ENCOUNTER — Encounter: Payer: Self-pay | Admitting: Family

## 2019-12-10 ENCOUNTER — Ambulatory Visit (INDEPENDENT_AMBULATORY_CARE_PROVIDER_SITE_OTHER): Payer: 59 | Admitting: Family

## 2019-12-10 ENCOUNTER — Ambulatory Visit (INDEPENDENT_AMBULATORY_CARE_PROVIDER_SITE_OTHER): Payer: 59

## 2019-12-10 ENCOUNTER — Other Ambulatory Visit: Payer: Self-pay

## 2019-12-10 VITALS — BP 130/82 | HR 87 | Temp 98.2°F | Ht 68.0 in | Wt 161.0 lb

## 2019-12-10 DIAGNOSIS — Z13220 Encounter for screening for lipoid disorders: Secondary | ICD-10-CM

## 2019-12-10 DIAGNOSIS — M542 Cervicalgia: Secondary | ICD-10-CM

## 2019-12-10 DIAGNOSIS — Z125 Encounter for screening for malignant neoplasm of prostate: Secondary | ICD-10-CM

## 2019-12-10 DIAGNOSIS — G4700 Insomnia, unspecified: Secondary | ICD-10-CM

## 2019-12-10 DIAGNOSIS — I10 Essential (primary) hypertension: Secondary | ICD-10-CM

## 2019-12-10 LAB — COMPREHENSIVE METABOLIC PANEL
ALT: 19 U/L (ref 0–53)
AST: 23 U/L (ref 0–37)
Albumin: 4.7 g/dL (ref 3.5–5.2)
Alkaline Phosphatase: 51 U/L (ref 39–117)
BUN: 12 mg/dL (ref 6–23)
CO2: 29 mEq/L (ref 19–32)
Calcium: 9.7 mg/dL (ref 8.4–10.5)
Chloride: 95 mEq/L — ABNORMAL LOW (ref 96–112)
Creatinine, Ser: 0.88 mg/dL (ref 0.40–1.50)
GFR: 95.09 mL/min (ref >60.00)
Glucose, Bld: 72 mg/dL (ref 70–99)
Potassium: 4.7 mEq/L (ref 3.5–5.1)
Sodium: 131 mEq/L — ABNORMAL LOW (ref 135–145)
Total Bilirubin: 0.6 mg/dL (ref 0.2–1.2)
Total Protein: 6.9 g/dL (ref 6.0–8.3)

## 2019-12-10 LAB — CBC WITH DIFFERENTIAL/PLATELET
Basophils Absolute: 0.1 10*3/uL (ref 0.0–0.1)
Basophils Relative: 0.6 % (ref 0.0–3.0)
Eosinophils Absolute: 0.1 10*3/uL (ref 0.0–0.7)
Eosinophils Relative: 1.5 % (ref 0.0–5.0)
HCT: 44.2 % (ref 39.0–52.0)
Hemoglobin: 15.4 g/dL (ref 13.0–17.0)
Lymphocytes Relative: 23.9 % (ref 12.0–46.0)
Lymphs Abs: 2 10*3/uL (ref 0.7–4.0)
MCHC: 34.8 g/dL (ref 30.0–36.0)
MCV: 95.8 fl (ref 78.0–100.0)
Monocytes Absolute: 0.9 10*3/uL (ref 0.1–1.0)
Monocytes Relative: 10.2 % (ref 3.0–12.0)
Neutro Abs: 5.4 10*3/uL (ref 1.4–7.7)
Neutrophils Relative %: 63.8 % (ref 43.0–77.0)
Platelets: 333 10*3/uL (ref 150.0–400.0)
RBC: 4.61 Mil/uL (ref 4.22–5.81)
RDW: 13.1 % (ref 11.5–15.5)
WBC: 8.5 10*3/uL (ref 4.0–10.5)

## 2019-12-10 LAB — LIPID PANEL
Cholesterol: 165 mg/dL (ref 0–200)
HDL: 51.5 mg/dL (ref >39.00)
LDL Cholesterol: 102 mg/dL — ABNORMAL HIGH (ref 0–99)
NonHDL: 113.96
Total CHOL/HDL Ratio: 3
Triglycerides: 59 mg/dL (ref 0.0–149.0)
VLDL: 11.8 mg/dL (ref 0.0–40.0)

## 2019-12-10 LAB — PSA: PSA: 0.45 ng/mL (ref 0.10–4.00)

## 2019-12-10 MED ORDER — ZOLPIDEM TARTRATE 10 MG PO TABS: 10.0000 mg | ORAL_TABLET | Freq: Every evening | ORAL | 1 refills | Status: DC | PRN

## 2019-12-10 MED ORDER — LOSARTAN POTASSIUM 50 MG PO TABS: 50.0000 mg | ORAL_TABLET | Freq: Every day | ORAL | 6 refills | Status: DC

## 2019-12-10 MED ORDER — TRAMADOL HCL 50 MG PO TABS: 50.0000 mg | ORAL_TABLET | Freq: Three times a day (TID) | ORAL | 0 refills | Status: DC | PRN

## 2019-12-10 MED ORDER — FINASTERIDE 5 MG PO TABS: 5.0000 mg | ORAL_TABLET | Freq: Every day | ORAL | 6 refills | Status: DC

## 2019-12-10 NOTE — Progress Notes (Signed)
Scott Walker is a 59 y.o. male with the following history as recorded in EpicCare:  Patient Active Problem List   Diagnosis Date Noted  . Concentration deficit 07/15/2013  . Increased prostate specific antigen (PSA) velocity 07/15/2013  . Preventative health care 05/03/2010  . INSOMNIA-SLEEP DISORDER-UNSPEC 10/25/2009  . ADD 02/24/2008  . PROSTATITIS, ACUTE 02/24/2008  . ARM PAIN, LEFT 02/24/2008  . HYPERLIPIDEMIA 11/18/2007  . ALLERGIC RHINITIS 11/18/2007  . BENIGN PROSTATIC HYPERTROPHY 11/18/2007  . LOW BACK PAIN 11/18/2007    Current Outpatient Medications  Medication Sig Dispense Refill  . finasteride (PROSCAR) 5 MG tablet Take 1 tablet (5 mg total) by mouth daily. 30 tablet 6  . losartan (COZAAR) 50 MG tablet Take 1 tablet (50 mg total) by mouth daily. 30 tablet 6  . traMADol (ULTRAM) 50 MG tablet Take 1 tablet (50 mg total) by mouth every 8 (eight) hours as needed. 20 tablet 0  . zolpidem (AMBIEN) 10 MG tablet Take 1 tablet (10 mg total) by mouth at bedtime as needed. for sleep 30 tablet 1   No current facility-administered medications for this visit.    Allergies: Statins  Past Medical History:  Diagnosis Date  . ADD 02/24/2008  . ALLERGIC RHINITIS 11/18/2007  . Allergy   . ARM PAIN, LEFT 02/24/2008  . BENIGN PROSTATIC HYPERTROPHY 11/18/2007  . GERD (gastroesophageal reflux disease)    recently-tried pantoprazole with help  . HYPERLIPIDEMIA 11/18/2007  . INSOMNIA-SLEEP DISORDER-UNSPEC 10/25/2009  . LOW BACK PAIN 11/18/2007  . PROSTATITIS, ACUTE 02/24/2008  . Routine general medical examination at a health care facility 04/26/2010  . SINUSITIS- ACUTE-NOS 12/28/2008  . Special screening for malignant neoplasms, ovary 04/26/2010    Past Surgical History:  Procedure Laterality Date  . SKIN CANCER EXCISION     left nose area  . VASECTOMY    . WISDOM TOOTH EXTRACTION      Family History  Problem Relation Age of Onset  . Hyperlipidemia Unknown   . Colon cancer  Neg Hx   . Colon polyps Neg Hx     Social History   Tobacco Use  . Smoking status: Former Smoker    Types: Cigars  . Smokeless tobacco: Never Used  . Tobacco comment: occasional cigar  Substance Use Topics  . Alcohol use: Yes    Alcohol/week: 0.0 standard drinks    Comment: moderate    Subjective:  Former patient of Dr. [name] here- would like to re-establish with him if possible; insurance required him to go to [facility] system until this year; History of hypertension, BPH, insomnia and chronic neck pain; notes he may take 1-2 Tramadol per week for management of this pain but pain is improved when he is able to exercise regularly;    Objective:  Vitals:   12/10/19 1000  BP: 130/82  Pulse: 87  Temp: 98.2 F (36.8 C)  TempSrc: Oral  SpO2: 99%  Weight: 161 lb (73 kg)  Height: 5' 8"  (1.727 m)    General: Well developed, well nourished, in no acute distress  Skin : Warm and dry.  Head: Normocephalic and atraumatic  Eyes: Sclera and conjunctiva clear; pupils round and reactive to light; extraocular movements intact  Ears: External normal; canals clear; tympanic membranes normal  Oropharynx: Pink, supple. No suspicious lesions  Neck: Supple without thyromegaly, adenopathy  Lungs: Respirations unlabored; clear to auscultation bilaterally without wheeze, rales, rhonchi  CVS exam: normal rate and regular rhythm.  Neurologic: Alert and oriented; speech intact; face symmetrical;  moves all extremities well; CNII-XII intact without focal deficit   Assessment:  1. Essential hypertension   2. Neck pain   3. Lipid screening   4. Prostate cancer screening   5. Insomnia, unspecified type     Plan:  1. Stable; refill updated on Losartan 50 mg daily; 2. Per patient, chronic issue but no records available; update imaging and recommend to see sports medicine; #20 Tramadol given today; 3. Check lipid panel; 4. Check PSA- refill given on Proscar; 5. Rx for Ambien 10 mg qhs prn;  This  visit occurred during the SARS-CoV-2 public health emergency.  Safety protocols were in place, including screening questions prior to the visit, additional usage of staff PPE, and extensive cleaning of exam room while observing appropriate contact time as indicated for disinfecting solutions.     Return for Dr. [name] in 6 months; former patient of Dr. [name]- going back to him as PCP.  Orders Placed This Encounter  Procedures  . DG Cervical Spine 2 or 3 views    Standing Status:   Future    Standing Expiration Date:   12/09/2020    Order Specific Question:   Reason for Exam (SYMPTOM  OR DIAGNOSIS REQUIRED)    Answer:   neck pain    Order Specific Question:   Preferred imaging location?    Answer:   [facility]  . CBC with Differential/Platelet    Standing Status:   Future    Number of Occurrences:   1    Standing Expiration Date:   12/09/2020  . Comp Met (CMET)    Standing Status:   Future    Number of Occurrences:   1    Standing Expiration Date:   12/09/2020  . Lipid panel    Standing Status:   Future    Number of Occurrences:   1    Standing Expiration Date:   12/09/2020  . PSA    Standing Status:   Future    Number of Occurrences:   1    Standing Expiration Date:   12/09/2020    Requested Prescriptions   Signed Prescriptions Disp Refills  . finasteride (PROSCAR) 5 MG tablet 30 tablet 6    Sig: Take 1 tablet (5 mg total) by mouth daily.  Marland Kitchen losartan (COZAAR) 50 MG tablet 30 tablet 6    Sig: Take 1 tablet (50 mg total) by mouth daily.  . traMADol (ULTRAM) 50 MG tablet 20 tablet 0    Sig: Take 1 tablet (50 mg total) by mouth every 8 (eight) hours as needed.  . zolpidem (AMBIEN) 10 MG tablet 30 tablet 1    Sig: Take 1 tablet (10 mg total) by mouth at bedtime as needed. for sleep

## 2020-01-18 ENCOUNTER — Other Ambulatory Visit: Payer: Self-pay | Admitting: Family

## 2020-01-19 ENCOUNTER — Encounter: Payer: Self-pay | Admitting: Family Medicine

## 2020-01-19 ENCOUNTER — Other Ambulatory Visit: Payer: Self-pay

## 2020-01-19 ENCOUNTER — Ambulatory Visit (INDEPENDENT_AMBULATORY_CARE_PROVIDER_SITE_OTHER): Payer: 59 | Admitting: Family Medicine

## 2020-01-19 VITALS — BP 128/86 | Ht 68.0 in | Wt 165.0 lb

## 2020-01-19 DIAGNOSIS — M542 Cervicalgia: Secondary | ICD-10-CM

## 2020-01-19 MED ORDER — DICLOFENAC SODIUM 75 MG PO TBEC: 75.0000 mg | DELAYED_RELEASE_TABLET | Freq: Two times a day (BID) | ORAL | 1 refills | Status: DC

## 2020-01-19 NOTE — Patient Instructions (Signed)
You have arthritis of your cervical spine that is irritating a nerve into your right arm. Consider prednisone dose pack Voltaren 75mg  twice a day with food for pain and inflammation - take for 7 days regularly then as needed. Consider cervical collar if severely painful. Simple range of motion exercises within limits of pain to prevent further stiffness. Consider physical therapy for stretching, exercises, traction, and modalities. Heat 15 minutes at a time 3-4 times a day to help with spasms. Watch head position when on computers, texting, when sleeping in bed - should in line with back to prevent further nerve traction and irritation. If not improving we will consider an MRI. Follow up with me in 1 month.

## 2020-01-19 NOTE — Progress Notes (Signed)
PCP: Biagio Borg, MD  Subjective:   HPI: Patient is a 58 y.o. male here for right upper extremity/shoulder pain.  Patient reports that this has been bothering him for the past few months.  He works at a call center for open enrollment for [employer] and he reports that if he is using the mouse on his computer after 30 minutes the pain is excruciating.  That is the only thing that he can find to elicit the pain.  He works out at [employer].  He has not been taking any anti-inflammatory medications.  He has not been stretching other than when he goes to the gym.  Reports pain over his right shoulder and halfway down his upper extremity.  Says that his manager is asking for him to be off for the next 2 weeks while he gets better.  No bowel/bladder dysfunction.  Some tingling associated with this.  Past Medical History:  Diagnosis Date  . ADD 02/24/2008  . ALLERGIC RHINITIS 11/18/2007  . Allergy   . ARM PAIN, LEFT 02/24/2008  . BENIGN PROSTATIC HYPERTROPHY 11/18/2007  . GERD (gastroesophageal reflux disease)    recently-tried pantoprazole with help  . HYPERLIPIDEMIA 11/18/2007  . INSOMNIA-SLEEP DISORDER-UNSPEC 10/25/2009  . LOW BACK PAIN 11/18/2007  . PROSTATITIS, ACUTE 02/24/2008  . Routine general medical examination at a health care facility 04/26/2010  . SINUSITIS- ACUTE-NOS 12/28/2008  . Special screening for malignant neoplasms, ovary 04/26/2010    Current Outpatient Medications on File Prior to Visit  Medication Sig Dispense Refill  . finasteride (PROSCAR) 5 MG tablet Take 1 tablet (5 mg total) by mouth daily. 30 tablet 6  . losartan (COZAAR) 50 MG tablet Take 1 tablet (50 mg total) by mouth daily. 30 tablet 6  . traMADol (ULTRAM) 50 MG tablet Take 1 tablet (50 mg total) by mouth every 8 (eight) hours as needed. 20 tablet 0  . zolpidem (AMBIEN) 10 MG tablet TAKE ONE TABLET BY MOUTH AT BEDTIME AS NEEDED FOR SLEEP 30 tablet 1   No current facility-administered medications on file prior to  visit.    Past Surgical History:  Procedure Laterality Date  . SKIN CANCER EXCISION     left nose area  . VASECTOMY    . WISDOM TOOTH EXTRACTION      Allergies  Allergen Reactions  . Statins Other (See Comments)    "Makes me feel weird"    Social History   Socioeconomic History  . Marital status: Married    Spouse name: Not on file  . Number of children: 3  . Years of education: Not on file  . Highest education level: Not on file  Occupational History  . Occupation: Art gallery manager  Tobacco Use  . Smoking status: Former Smoker    Types: Cigars  . Smokeless tobacco: Never Used  . Tobacco comment: occasional cigar  Substance and Sexual Activity  . Alcohol use: Yes    Alcohol/week: 0.0 standard drinks    Comment: moderate  . Drug use: No  . Sexual activity: Not on file  Other Topics Concern  . Not on file  Social History Narrative  . Not on file   Social Determinants of Health   Financial Resource Strain: Not on file  Food Insecurity: Not on file  Transportation Needs: Not on file  Physical Activity: Not on file  Stress: Not on file  Social Connections: Not on file  Intimate Partner Violence: Not on file    Family History  Problem Relation  Age of Onset  . Hyperlipidemia Other   . Colon cancer Neg Hx   . Colon polyps Neg Hx     BP 128/86   Ht 5\' 8"  (1.727 m)   Wt 165 lb (74.8 kg)   BMI 25.09 kg/m   No flowsheet data found.  No flowsheet data found.  Review of Systems: See HPI above.     Objective:  Physical Exam:  Gen: NAD, comfortable in exam room  Neck: No gross deformity, swelling, bruising. No TTP.  No midline/bony TTP. FROM. BUE strength 5/5.   Sensation intact to light touch.   Negative spurlings. NV intact distal BUEs.  Right shoulder: No swelling, ecchymoses.  No gross deformity. No TTP. FROM. Cervical spine pain with supraspinatus impingement testing Negative Hawkins, Neers. Negative Yergasons. Strength 5/5 with empty  can and resisted internal/external rotation. Negative apprehension. NV intact distally.   Assessment & Plan:  Arthritis of the cervical spine Patient has 74-month history of pain in his right shoulder as well as in his neck after using the computer mouse for 30 minutes or more.  Physical exam consistent with radiating pain from his cervical spine.  We discussed considering a steroid Dosepak to help with the inflammation.  Recommended Voltaren gel as well as provided range of motion exercises.  We will consider further imaging if no improvement in the next month.  Radiographs reviewed and noted degenerative changes as expected that are contributory.  Follow-up in 1 month

## 2020-02-29 ENCOUNTER — Encounter: Payer: Self-pay | Admitting: Internal Medicine

## 2020-02-29 ENCOUNTER — Telehealth: Payer: Self-pay | Admitting: Internal Medicine

## 2020-02-29 DIAGNOSIS — G4700 Insomnia, unspecified: Secondary | ICD-10-CM

## 2020-02-29 DIAGNOSIS — U071 COVID-19: Secondary | ICD-10-CM

## 2020-02-29 MED ORDER — ALBUTEROL SULFATE HFA 108 (90 BASE) MCG/ACT IN AERS: 2.0000 | INHALATION_SPRAY | Freq: Four times a day (QID) | RESPIRATORY_TRACT | 0 refills | Status: DC | PRN

## 2020-02-29 NOTE — Telephone Encounter (Signed)
Patient is on day 7 of covid and is experiencing fatigue, some shortness of breath, and foggy head. Patient wondering if he can get a referral to the infusion center.

## 2020-02-29 NOTE — Telephone Encounter (Signed)
Ok I sent an inhaler to the pharmacy  Also did the referral but Im not sure if he can get the monoclonal ab due to FDA stopping the emergency use authorization for 2 of the 3 monoclonal ab treatments just a few days ago as they do not work for [employer]

## 2020-02-29 NOTE — Telephone Encounter (Signed)
See below

## 2020-03-01 ENCOUNTER — Telehealth: Payer: Self-pay

## 2020-03-01 NOTE — Telephone Encounter (Signed)
Pt has been notified.

## 2020-05-08 ENCOUNTER — Other Ambulatory Visit: Payer: Self-pay | Admitting: Family

## 2020-05-25 ENCOUNTER — Other Ambulatory Visit: Payer: Self-pay | Admitting: Family

## 2020-06-05 ENCOUNTER — Other Ambulatory Visit: Payer: Self-pay

## 2020-06-06 ENCOUNTER — Ambulatory Visit (INDEPENDENT_AMBULATORY_CARE_PROVIDER_SITE_OTHER): Payer: 59 | Admitting: Internal Medicine

## 2020-06-06 ENCOUNTER — Telehealth: Payer: Self-pay | Admitting: Internal Medicine

## 2020-06-06 ENCOUNTER — Encounter: Payer: Self-pay | Admitting: Internal Medicine

## 2020-06-06 VITALS — BP 138/72 | HR 86 | Temp 98.1°F | Ht 68.0 in | Wt 158.4 lb

## 2020-06-06 DIAGNOSIS — M545 Low back pain, unspecified: Secondary | ICD-10-CM

## 2020-06-06 DIAGNOSIS — I10 Essential (primary) hypertension: Secondary | ICD-10-CM

## 2020-06-06 DIAGNOSIS — E538 Deficiency of other specified B group vitamins: Secondary | ICD-10-CM | POA: Diagnosis not present

## 2020-06-06 DIAGNOSIS — E559 Vitamin D deficiency, unspecified: Secondary | ICD-10-CM | POA: Diagnosis not present

## 2020-06-06 DIAGNOSIS — R972 Elevated prostate specific antigen [PSA]: Secondary | ICD-10-CM

## 2020-06-06 DIAGNOSIS — N452 Orchitis: Secondary | ICD-10-CM

## 2020-06-06 DIAGNOSIS — G8929 Other chronic pain: Secondary | ICD-10-CM

## 2020-06-06 DIAGNOSIS — Z0001 Encounter for general adult medical examination with abnormal findings: Secondary | ICD-10-CM

## 2020-06-06 MED ORDER — ZOLPIDEM TARTRATE 10 MG PO TABS: 10.0000 mg | ORAL_TABLET | Freq: Every evening | ORAL | 1 refills | Status: DC | PRN

## 2020-06-06 MED ORDER — DOXYCYCLINE HYCLATE 100 MG PO TABS: 100.0000 mg | ORAL_TABLET | Freq: Two times a day (BID) | ORAL | 0 refills | Status: DC

## 2020-06-06 MED ORDER — FINASTERIDE 5 MG PO TABS: 5.0000 mg | ORAL_TABLET | Freq: Every day | ORAL | 3 refills | Status: DC

## 2020-06-06 MED ORDER — TRAMADOL HCL 50 MG PO TABS: 50.0000 mg | ORAL_TABLET | ORAL | 0 refills | Status: DC | PRN

## 2020-06-06 MED ORDER — LOSARTAN POTASSIUM 50 MG PO TABS: 50.0000 mg | ORAL_TABLET | Freq: Every day | ORAL | 3 refills | Status: DC

## 2020-06-06 NOTE — Progress Notes (Signed)
Patient ID: Scott Walker, male   DOB: 04-22-1960, 60 y.o.   MRN: [id number]         Chief Complaint:: wellness exam and Transitions Of Care Scott Walker patient; 6 month f/u) , left testicle pain, chronic lbp        HPI:  Scott Walker is a 60 y.o. male here for wellness exam; decliens covid vax, tdap, hiv screen, o/w up to date with preventive referrals and immunizations.                        Also c/o 3 days onset left testicle soreness swelling and tender, without fever and Denies urinary symptoms such as dysuria, frequency, urgency, flank pain, hematuria or n/v, fever, chills.  Pt continues to have recurring LBP without change in severity, bowel or bladder change, fever, wt loss,  worsening LE pain/numbness/weakness, gait change or falls. Pt denies chest pain, increased sob or doe, wheezing, orthopnea, PND, increased LE swelling, palpitations, dizziness or syncope.   Pt denies polydipsia, polyuria, or new focal neuro s/s.   Pt denies fever, wt loss, night sweats, loss of appetite, or other constitutional symptoms  Wt Readings from Last 3 Encounters:  06/06/20 158 lb 6.4 oz (71.8 kg)  01/19/20 165 lb (74.8 kg)  12/10/19 161 lb (73 kg)   BP Readings from Last 3 Encounters:  06/06/20 138/72  01/19/20 128/86  12/10/19 130/82   Immunization History  Administered Date(s) Administered  . Td 12/28/2008  . Zoster 03/28/2015   There are no preventive care reminders to display for this patient.    Past Medical History:  Diagnosis Date  . ADD 02/24/2008  . ALLERGIC RHINITIS 11/18/2007  . Allergy   . ARM PAIN, LEFT 02/24/2008  . BENIGN PROSTATIC HYPERTROPHY 11/18/2007  . GERD (gastroesophageal reflux disease)    recently-tried pantoprazole with help  . HYPERLIPIDEMIA 11/18/2007  . INSOMNIA-SLEEP DISORDER-UNSPEC 10/25/2009  . LOW BACK PAIN 11/18/2007  . PROSTATITIS, ACUTE 02/24/2008  . Routine general medical examination at a health care facility 04/26/2010  . SINUSITIS- ACUTE-NOS  12/28/2008  . Special screening for malignant neoplasms, ovary 04/26/2010   Past Surgical History:  Procedure Laterality Date  . SKIN CANCER EXCISION     left nose area  . VASECTOMY    . WISDOM TOOTH EXTRACTION      reports that he has quit smoking. His smoking use included cigars. He has never used smokeless tobacco. He reports current alcohol use of about 6.0 standard drinks of alcohol per week. He reports that he does not use drugs. family history includes Hyperlipidemia in an other family member. Allergies  Allergen Reactions  . Statins Other (See Comments)    "Makes me feel weird"   No current outpatient medications on file prior to visit.   No current facility-administered medications on file prior to visit.        ROS:  All others reviewed and negative.  Objective        PE:  BP 138/72 (BP Location: Right Arm, Patient Position: Sitting, Cuff Size: Normal)   Pulse 86   Temp 98.1 F (36.7 C) (Oral)   Ht 5\' 8"  (1.727 m)   Wt 158 lb 6.4 oz (71.8 kg)   SpO2 97%   BMI 24.08 kg/m                 Constitutional: Pt appears in NAD  HENT: Head: NCAT.                Right Ear: External ear normal.                 Left Ear: External ear normal.                Eyes: . Pupils are equal, round, and reactive to light. Conjunctivae and EOM are normal               Nose: without d/c or deformity               Neck: Neck supple. Gross normal ROM               Cardiovascular: Normal rate and regular rhythm.                 Pulmonary/Chest: Effort normal and breath sounds without rales or wheezing.                Abd:  Soft, NT, ND, + BS, no organomegaly               Neurological: Pt is alert. At baseline orientation, motor grossly intact               Skin: Skin is warm. No rashes, no other new lesions, LE edema - none               Left testicle mild tender swelling without mass or scrotum change               Psychiatric: Pt behavior is normal without agitation    Micro: none  Cardiac tracings I have personally interpreted today:  none  Pertinent Radiological findings (summarize): none   Lab Results  Component Value Date   WBC 8.5 12/10/2019   HGB 15.4 12/10/2019   HCT 44.2 12/10/2019   PLT 333.0 12/10/2019   GLUCOSE 72 12/10/2019   CHOL 165 12/10/2019   TRIG 59.0 12/10/2019   HDL 51.50 12/10/2019   LDLDIRECT 158.0 12/23/2008   LDLCALC 102 (H) 12/10/2019   ALT 19 12/10/2019   AST 23 12/10/2019   NA 131 (L) 12/10/2019   K 4.7 12/10/2019   CL 95 (L) 12/10/2019   CREATININE 0.88 12/10/2019   BUN 12 12/10/2019   CO2 29 12/10/2019   TSH 0.59 10/12/2014   PSA 0.45 12/10/2019   Assessment/Plan:  Scott Walker is a 60 y.o. White or Caucasian [1] male with  has a past medical history of ADD (02/24/2008), ALLERGIC RHINITIS (11/18/2007), Allergy, ARM PAIN, LEFT (02/24/2008), BENIGN PROSTATIC HYPERTROPHY (11/18/2007), GERD (gastroesophageal reflux disease), HYPERLIPIDEMIA (11/18/2007), INSOMNIA-SLEEP DISORDER-UNSPEC (10/25/2009), LOW BACK PAIN (11/18/2007), PROSTATITIS, ACUTE (02/24/2008), Routine general medical examination at a health care facility (04/26/2010), SINUSITIS- ACUTE-NOS (12/28/2008), and Special screening for malignant neoplasms, ovary (04/26/2010).  Encounter for well adult exam with abnormal findings Age and sex appropriate education and counseling updated with regular exercise and diet Referrals for preventative services - declines hiv Immunizations addressed - declines covid vax, tdap Smoking counseling  - none needed Evidence for depression or other mood disorder - none significant Most recent labs reviewed. I have personally reviewed and have noted: 1) the patient's medical and social history 2) The patient's current medications and supplements 3) The patient's height, weight, and BMI have been recorded in the chart   Orchitis Mild to mod, for antibx course,  to f/u any worsening symptoms or concerns  Increased  prostate specific antigen (  PSA) velocity Asympt, for f/u psa  Chronic low back pain Stable, no neuro changes, for tramadol prn  Essential hypertension BP Readings from Last 3 Encounters:  06/06/20 138/72  01/19/20 128/86  12/10/19 130/82   Stable, pt to continue medical treatment losartan   HYPERLIPIDEMIA Lab Results  Component Value Date   LDLCALC 102 (H) 12/10/2019   Stable, pt to continue current low chol diet   Followup: Return in about 1 year (around 06/06/2021).  [name], MD 06/14/2020 7:43 PM [facility]

## 2020-06-06 NOTE — Telephone Encounter (Signed)
[facility] called and is needing the prescription for traMADol (ULTRAM) 50 MG tablet to say how many to take and how often. Please advise   Phone: [phone]

## 2020-06-06 NOTE — Patient Instructions (Addendum)
Please take all new medication as prescribed -the antibiotic  Please continue all other medications as before, and refills have been done if requested.  Please have the pharmacy call with any other refills you may need.  Please continue your efforts at being more active, low cholesterol diet, and weight control.  You are otherwise up to date with prevention measures today.  Please keep your appointments with your specialists as you may have planned  Please go to the LAB at the blood drawing area for the tests to be done  You will be contacted by phone if any changes need to be made immediately.  Otherwise, you will receive a letter about your results with an explanation, but please check with MyChart first.  Please remember to sign up for MyChart if you have not done so, as this will be important to you in the future with finding out test results, communicating by private email, and scheduling acute appointments online when needed.

## 2020-06-07 NOTE — Telephone Encounter (Signed)
Follow up message  [facility] calling to clarify instructions for Tramadol  Please call

## 2020-06-08 NOTE — Telephone Encounter (Signed)
Spoke with pharmacy and they state that they need specific directions if patient needs to take medication "daily as needed" (how often)

## 2020-06-09 MED ORDER — TRAMADOL HCL 50 MG PO TABS: 50.0000 mg | ORAL_TABLET | Freq: Four times a day (QID) | ORAL | 0 refills | Status: DC | PRN

## 2020-06-09 NOTE — Telephone Encounter (Signed)
Ok this has been corrected 

## 2020-06-09 NOTE — Telephone Encounter (Signed)
See below

## 2020-06-14 ENCOUNTER — Encounter: Payer: Self-pay | Admitting: Internal Medicine

## 2020-06-14 DIAGNOSIS — M545 Low back pain, unspecified: Secondary | ICD-10-CM | POA: Insufficient documentation

## 2020-06-14 DIAGNOSIS — G8929 Other chronic pain: Secondary | ICD-10-CM | POA: Insufficient documentation

## 2020-06-14 DIAGNOSIS — N452 Orchitis: Secondary | ICD-10-CM | POA: Insufficient documentation

## 2020-06-14 DIAGNOSIS — I10 Essential (primary) hypertension: Secondary | ICD-10-CM | POA: Insufficient documentation

## 2020-06-14 NOTE — Assessment & Plan Note (Signed)
Lab Results  Component Value Date   LDLCALC 102 (H) 12/10/2019   Stable, pt to continue current low chol diet

## 2020-06-14 NOTE — Assessment & Plan Note (Signed)
Asympt, for f/u psa 

## 2020-06-14 NOTE — Assessment & Plan Note (Signed)
Mild to mod, for antibx course,  to f/u any worsening symptoms or concerns 

## 2020-06-14 NOTE — Assessment & Plan Note (Signed)
Stable, no neuro changes, for tramadol prn

## 2020-06-14 NOTE — Assessment & Plan Note (Signed)
Age and sex appropriate education and counseling updated with regular exercise and diet Referrals for preventative services - declines hiv Immunizations addressed - declines covid vax, tdap Smoking counseling  - none needed Evidence for depression or other mood disorder - none significant Most recent labs reviewed. I have personally reviewed and have noted: 1) the patient's medical and social history 2) The patient's current medications and supplements 3) The patient's height, weight, and BMI have been recorded in the chart

## 2020-06-14 NOTE — Assessment & Plan Note (Signed)
BP Readings from Last 3 Encounters:  06/06/20 138/72  01/19/20 128/86  12/10/19 130/82   Stable, pt to continue medical treatment losartan

## 2020-06-16 ENCOUNTER — Encounter: Payer: Self-pay | Admitting: Internal Medicine

## 2020-06-16 MED ORDER — DOXYCYCLINE HYCLATE 100 MG PO TABS: 100.0000 mg | ORAL_TABLET | Freq: Two times a day (BID) | ORAL | 0 refills | Status: DC

## 2020-08-18 ENCOUNTER — Encounter: Payer: Self-pay | Admitting: Internal Medicine

## 2020-08-18 DIAGNOSIS — N5082 Scrotal pain: Secondary | ICD-10-CM

## 2020-09-04 ENCOUNTER — Other Ambulatory Visit: Payer: Self-pay | Admitting: Internal Medicine

## 2020-10-02 ENCOUNTER — Other Ambulatory Visit: Payer: Self-pay | Admitting: Family

## 2020-11-28 ENCOUNTER — Other Ambulatory Visit: Payer: Self-pay | Admitting: Internal Medicine

## 2021-05-01 ENCOUNTER — Other Ambulatory Visit: Payer: Self-pay | Admitting: Internal Medicine

## 2021-05-29 ENCOUNTER — Other Ambulatory Visit: Payer: Self-pay | Admitting: Internal Medicine

## 2021-05-29 NOTE — Telephone Encounter (Signed)
Please refill as per office routine med refill policy (all routine meds to be refilled for 3 mo or monthly (per pt preference) up to one year from last visit, then month to month grace period for 3 mo, then further med refills will have to be denied) ? ?

## 2021-06-07 ENCOUNTER — Other Ambulatory Visit: Payer: Self-pay | Admitting: Internal Medicine

## 2021-06-07 NOTE — Telephone Encounter (Signed)
Please refill as per office routine med refill policy (all routine meds to be refilled for 3 mo or monthly (per pt preference) up to one year from last visit, then month to month grace period for 3 mo, then further med refills will have to be denied) ? ?

## 2021-06-22 ENCOUNTER — Encounter: Payer: Self-pay | Admitting: Internal Medicine

## 2021-06-22 ENCOUNTER — Ambulatory Visit (INDEPENDENT_AMBULATORY_CARE_PROVIDER_SITE_OTHER): Payer: BC Managed Care – PPO | Admitting: Internal Medicine

## 2021-06-22 VITALS — BP 128/76 | HR 77 | Temp 98.5°F | Ht 68.0 in | Wt 161.8 lb

## 2021-06-22 DIAGNOSIS — E559 Vitamin D deficiency, unspecified: Secondary | ICD-10-CM

## 2021-06-22 DIAGNOSIS — Z114 Encounter for screening for human immunodeficiency virus [HIV]: Secondary | ICD-10-CM

## 2021-06-22 DIAGNOSIS — I10 Essential (primary) hypertension: Secondary | ICD-10-CM

## 2021-06-22 DIAGNOSIS — N401 Enlarged prostate with lower urinary tract symptoms: Secondary | ICD-10-CM | POA: Diagnosis not present

## 2021-06-22 DIAGNOSIS — Z125 Encounter for screening for malignant neoplasm of prostate: Secondary | ICD-10-CM

## 2021-06-22 DIAGNOSIS — E7800 Pure hypercholesterolemia, unspecified: Secondary | ICD-10-CM | POA: Diagnosis not present

## 2021-06-22 DIAGNOSIS — E538 Deficiency of other specified B group vitamins: Secondary | ICD-10-CM | POA: Diagnosis not present

## 2021-06-22 DIAGNOSIS — N138 Other obstructive and reflux uropathy: Secondary | ICD-10-CM

## 2021-06-22 DIAGNOSIS — Z0001 Encounter for general adult medical examination with abnormal findings: Secondary | ICD-10-CM

## 2021-06-22 DIAGNOSIS — R739 Hyperglycemia, unspecified: Secondary | ICD-10-CM

## 2021-06-22 LAB — CBC WITH DIFFERENTIAL/PLATELET
Basophils Absolute: 0.1 10*3/uL (ref 0.0–0.1)
Basophils Relative: 1.1 % (ref 0.0–3.0)
Eosinophils Absolute: 0.2 10*3/uL (ref 0.0–0.7)
Eosinophils Relative: 2.3 % (ref 0.0–5.0)
HCT: 45.3 % (ref 39.0–52.0)
Hemoglobin: 15.5 g/dL (ref 13.0–17.0)
Lymphocytes Relative: 29.1 % (ref 12.0–46.0)
Lymphs Abs: 2.2 10*3/uL (ref 0.7–4.0)
MCHC: 34.3 g/dL (ref 30.0–36.0)
MCV: 95.9 fl (ref 78.0–100.0)
Monocytes Absolute: 0.7 10*3/uL (ref 0.1–1.0)
Monocytes Relative: 9.3 % (ref 3.0–12.0)
Neutro Abs: 4.5 10*3/uL (ref 1.4–7.7)
Neutrophils Relative %: 58.2 % (ref 43.0–77.0)
Platelets: 330 10*3/uL (ref 150.0–400.0)
RBC: 4.72 Mil/uL (ref 4.22–5.81)
RDW: 13.3 % (ref 11.5–15.5)
WBC: 7.7 10*3/uL (ref 4.0–10.5)

## 2021-06-22 LAB — HEPATIC FUNCTION PANEL
ALT: 17 U/L (ref 0–53)
AST: 17 U/L (ref 0–37)
Albumin: 4.5 g/dL (ref 3.5–5.2)
Alkaline Phosphatase: 53 U/L (ref 39–117)
Bilirubin, Direct: 0.1 mg/dL (ref 0.0–0.3)
Total Bilirubin: 0.5 mg/dL (ref 0.2–1.2)
Total Protein: 6.8 g/dL (ref 6.0–8.3)

## 2021-06-22 LAB — URINALYSIS, ROUTINE W REFLEX MICROSCOPIC
Bilirubin Urine: NEGATIVE
Hgb urine dipstick: NEGATIVE
Ketones, ur: NEGATIVE
Leukocytes,Ua: NEGATIVE
Nitrite: NEGATIVE
RBC / HPF: NONE SEEN (ref 0–?)
Specific Gravity, Urine: 1.025 (ref 1.000–1.030)
Total Protein, Urine: NEGATIVE
Urine Glucose: NEGATIVE
Urobilinogen, UA: 0.2 (ref 0.0–1.0)
pH: 5.5 (ref 5.0–8.0)

## 2021-06-22 LAB — BASIC METABOLIC PANEL
BUN: 10 mg/dL (ref 6–23)
CO2: 25 mEq/L (ref 19–32)
Calcium: 9.5 mg/dL (ref 8.4–10.5)
Chloride: 100 mEq/L (ref 96–112)
Creatinine, Ser: 1.01 mg/dL (ref 0.40–1.50)
GFR: 81.36 mL/min (ref >60.00)
Glucose, Bld: 85 mg/dL (ref 70–99)
Potassium: 4.5 mEq/L (ref 3.5–5.1)
Sodium: 134 mEq/L — ABNORMAL LOW (ref 135–145)

## 2021-06-22 LAB — HEMOGLOBIN A1C: Hgb A1c MFr Bld: 5.5 % (ref 4.6–6.5)

## 2021-06-22 LAB — LIPID PANEL
Cholesterol: 174 mg/dL (ref 0–200)
HDL: 50.5 mg/dL (ref >39.00)
LDL Cholesterol: 109 mg/dL — ABNORMAL HIGH (ref 0–99)
NonHDL: 123.04
Total CHOL/HDL Ratio: 3
Triglycerides: 68 mg/dL (ref 0.0–149.0)
VLDL: 13.6 mg/dL (ref 0.0–40.0)

## 2021-06-22 LAB — VITAMIN D 25 HYDROXY (VIT D DEFICIENCY, FRACTURES): VITD: 30.98 ng/mL (ref 30.00–100.00)

## 2021-06-22 LAB — PSA: PSA: 0.3 ng/mL (ref 0.10–4.00)

## 2021-06-22 LAB — VITAMIN B12: Vitamin B-12: 122 pg/mL — ABNORMAL LOW (ref 211–911)

## 2021-06-22 LAB — TSH: TSH: 1.35 u[IU]/mL (ref 0.35–5.50)

## 2021-06-22 NOTE — Patient Instructions (Addendum)
Please go to local pharmacy for the Shingrix shingle shot if covered  by your insurance .  Please continue all other medications as before, and refills have been done if requested.  Please have the pharmacy call with any other refills you may need.  Please continue your efforts at being more active, low cholesterol diet, and weight control.  You are otherwise up to date with prevention measures today.  Please keep your appointments with your specialists as you may have planned  Please go to the LAB at the blood drawing area for the tests to be done  You will be contacted by phone if any changes need to be made immediately.  Otherwise, you will receive a letter about your results with an explanation, but please check with MyChart first.  Please remember to sign up for MyChart if you have not done so, as this will be important to you in the future with finding out test results, communicating by private email, and scheduling acute appointments online when needed.  Please make an Appointment to return for your 1 year visit, or sooner if needed

## 2021-06-22 NOTE — Assessment & Plan Note (Signed)
Age and sex appropriate education and counseling updated with regular exercise and diet Referrals for preventative services - none needed Immunizations addressed - declnes covid booster, shingrix, tdap Smoking counseling  - none needed Evidence for depression or other mood disorder - none significant Most recent labs reviewed. I have personally reviewed and have noted: 1) the patient's medical and social history 2) The patient's current medications and supplements 3) The patient's height, weight, and BMI have been recorded in the chart

## 2021-06-22 NOTE — Progress Notes (Signed)
Patient ID: Scott Walker, male   DOB: April 05, 1960, 61 y.o.   MRN: [id number]         Chief Complaint:: wellness exam and Annual Exam (Patient states that he still has frequent urination that's lasted for years)  , htn, hld,        HPI:  Scott Walker is a 61 y.o. male here for wellness exam; declines covid booster, shingrix, tdap o/w up to date                        Also Denies urinary symptoms such as dysuria, frequency, urgency, flank pain, hematuria or n/v, fever, chills except for baseline mild urinary frequency no change and once per night nocturia.  Declines flomax or vesicare trial for now, but may if worsens.  Pt denies chest pain, increased sob or doe, wheezing, orthopnea, PND, increased LE swelling, palpitations, dizziness or syncope.   Pt denies polydipsia, polyuria, or new focal neuro s/s.    Pt denies fever, wt loss, night sweats, loss of appetite, or other constitutional symptoms  trying to follow lower chol diet.  Wt overall stable   Wt Readings from Last 3 Encounters:  06/22/21 161 lb 12.8 oz (73.4 kg)  06/06/20 158 lb 6.4 oz (71.8 kg)  01/19/20 165 lb (74.8 kg)   BP Readings from Last 3 Encounters:  06/22/21 128/76  06/06/20 138/72  01/19/20 128/86   Immunization History  Administered Date(s) Administered   Td 12/28/2008   Zoster, Live 03/28/2015   There are no preventive care reminders to display for this patient.     Past Medical History:  Diagnosis Date   ADD 02/24/2008   ALLERGIC RHINITIS 11/18/2007   Allergy    ARM PAIN, LEFT 02/24/2008   BENIGN PROSTATIC HYPERTROPHY 11/18/2007   GERD (gastroesophageal reflux disease)    recently-tried pantoprazole with help   HYPERLIPIDEMIA 11/18/2007   INSOMNIA-SLEEP DISORDER-UNSPEC 10/25/2009   LOW BACK PAIN 11/18/2007   PROSTATITIS, ACUTE 02/24/2008   Routine general medical examination at a health care facility 04/26/2010   SINUSITIS- ACUTE-NOS 12/28/2008   Special screening for malignant neoplasms, ovary  04/26/2010   Past Surgical History:  Procedure Laterality Date   SKIN CANCER EXCISION     left nose area   VASECTOMY     WISDOM TOOTH EXTRACTION      reports that he has quit smoking. His smoking use included cigars. He has never used smokeless tobacco. He reports current alcohol use of about 6.0 standard drinks per week. He reports that he does not use drugs. family history includes Hyperlipidemia in an other family member. Allergies  Allergen Reactions   Statins Other (See Comments)    "Makes me feel weird"   Current Outpatient Medications on File Prior to Visit  Medication Sig Dispense Refill   finasteride (PROSCAR) 5 MG tablet Take 1 tablet (5 mg total) by mouth daily. 90 tablet 3   losartan (COZAAR) 50 MG tablet Take 1 tablet (50 mg total) by mouth daily. 30 tablet 0   traMADol (ULTRAM) 50 MG tablet Take 1 tablet (50 mg total) by mouth every 6 (six) hours as needed. 120 tablet 0   zolpidem (AMBIEN) 10 MG tablet Take 1 tablet (10 mg total) by mouth at bedtime as needed. for sleep 90 tablet 0   No current facility-administered medications on file prior to visit.        ROS:  All others reviewed and negative.  Objective  PE:  BP 128/76 (BP Location: Right Arm, Patient Position: Sitting, Cuff Size: Large)   Pulse 77   Temp 98.5 F (36.9 C) (Oral)   Ht '5\' 8"'$  (1.727 m)   Wt 161 lb 12.8 oz (73.4 kg)   SpO2 96%   BMI 24.60 kg/m                 Constitutional: Pt appears in NAD               HENT: Head: NCAT.                Right Ear: External ear normal.                 Left Ear: External ear normal.                Eyes: . Pupils are equal, round, and reactive to light. Conjunctivae and EOM are normal               Nose: without d/c or deformity               Neck: Neck supple. Gross normal ROM               Cardiovascular: Normal rate and regular rhythm.                 Pulmonary/Chest: Effort normal and breath sounds without rales or wheezing.                Abd:   Soft, NT, ND, + BS, no organomegaly               Neurological: Pt is alert. At baseline orientation, motor grossly intact               Skin: Skin is warm. No rashes, no other new lesions, LE edema - none               Psychiatric: Pt behavior is normal without agitation   Micro: none  Cardiac tracings I have personally interpreted today:  none  Pertinent Radiological findings (summarize): none   Lab Results  Component Value Date   WBC 8.5 12/10/2019   HGB 15.4 12/10/2019   HCT 44.2 12/10/2019   PLT 333.0 12/10/2019   GLUCOSE 72 12/10/2019   CHOL 165 12/10/2019   TRIG 59.0 12/10/2019   HDL 51.50 12/10/2019   LDLDIRECT 158.0 12/23/2008   LDLCALC 102 (H) 12/10/2019   ALT 19 12/10/2019   AST 23 12/10/2019   NA 131 (L) 12/10/2019   K 4.7 12/10/2019   CL 95 (L) 12/10/2019   CREATININE 0.88 12/10/2019   BUN 12 12/10/2019   CO2 29 12/10/2019   TSH 0.59 10/12/2014   PSA 0.45 12/10/2019   Assessment/Plan:  Scott Walker is a 61 y.o. White or Caucasian [1] male with  has a past medical history of ADD (02/24/2008), ALLERGIC RHINITIS (11/18/2007), Allergy, ARM PAIN, LEFT (02/24/2008), BENIGN PROSTATIC HYPERTROPHY (11/18/2007), GERD (gastroesophageal reflux disease), HYPERLIPIDEMIA (11/18/2007), INSOMNIA-SLEEP DISORDER-UNSPEC (10/25/2009), LOW BACK PAIN (11/18/2007), PROSTATITIS, ACUTE (02/24/2008), Routine general medical examination at a health care facility (04/26/2010), SINUSITIS- ACUTE-NOS (12/28/2008), and Special screening for malignant neoplasms, ovary (04/26/2010).  HLD (hyperlipidemia) . Lab Results  Component Value Date   LDLCALC 102 (H) 12/10/2019   uncontrolled, pt to continue current low chol diet, declines statin   Encounter for well adult exam with abnormal findings Age and sex appropriate education and counseling updated with regular exercise and diet  Referrals for preventative services - none needed Immunizations addressed - declnes covid booster, shingrix,  tdap Smoking counseling  - none needed Evidence for depression or other mood disorder - none significant Most recent labs reviewed. I have personally reviewed and have noted: 1) the patient's medical and social history 2) The patient's current medications and supplements 3) The patient's height, weight, and BMI have been recorded in the chart   BPH with obstruction/lower urinary tract symptoms Mild chronic persistent, delcines med trial such as flomax or vescare for now, also for UA with labs  Essential hypertension BP Readings from Last 3 Encounters:  06/22/21 128/76  06/06/20 138/72  01/19/20 128/86   Stable, pt to continue medical treatment losartan  Followup: Return in about 1 year (around 06/23/2022).  [name], MD 06/22/2021 9:52 AM [facility]

## 2021-06-22 NOTE — Assessment & Plan Note (Signed)
BP Readings from Last 3 Encounters:  06/22/21 128/76  06/06/20 138/72  01/19/20 128/86   Stable, pt to continue medical treatment losartan

## 2021-06-22 NOTE — Assessment & Plan Note (Signed)
.   Lab Results  Component Value Date   LDLCALC 102 (H) 12/10/2019   uncontrolled, pt to continue current low chol diet, declines statin

## 2021-06-22 NOTE — Assessment & Plan Note (Addendum)
Mild chronic persistent, delcines med trial such as flomax or vescare for now, also for UA with labs

## 2021-07-23 ENCOUNTER — Other Ambulatory Visit: Payer: Self-pay | Admitting: Internal Medicine

## 2021-07-24 ENCOUNTER — Telehealth: Payer: Self-pay | Admitting: *Deleted

## 2021-07-24 NOTE — Telephone Encounter (Signed)
Pt was on cover-my-meds need PA on Tramadol. Submitted PA w/ Key: [id number]. Pa sent to insurance for determination.Marland KitchenJohny Chess

## 2021-07-25 NOTE — Telephone Encounter (Signed)
Checking status on PA rec'd msg..." The plan will fax you a determination, typically within 1 to 5 business days." .Marland KitchenJohny Walker

## 2021-07-27 NOTE — Telephone Encounter (Signed)
Rec'd determination letter med was DENIED it states med does not meet our Opoid containing product criteria..Faxed denial to pof.Marland KitchenRaechel Chute

## 2021-08-24 ENCOUNTER — Other Ambulatory Visit: Payer: Self-pay | Admitting: Internal Medicine

## 2021-08-24 ENCOUNTER — Telehealth: Payer: Self-pay | Admitting: Internal Medicine

## 2021-08-24 NOTE — Telephone Encounter (Signed)
Caller & Relationship to patient:self   Call back [phone]   Date of last office visit:06/22/2021   Date of next office visit:   Medication(s) to be refilled:zolpidem (AMBIEN) 10 MG tablet        Preferred Pharmacy:  [place], [place] Phone:  [phone]

## 2021-09-10 ENCOUNTER — Other Ambulatory Visit: Payer: Self-pay | Admitting: Internal Medicine

## 2021-09-10 NOTE — Telephone Encounter (Signed)
Please refill as per office routine med refill policy (all routine meds to be refilled for 3 mo or monthly (per pt preference) up to one year from last visit, then month to month grace period for 3 mo, then further med refills will have to be denied) ? ?

## 2021-09-17 ENCOUNTER — Encounter: Payer: Self-pay | Admitting: Internal Medicine

## 2021-10-09 DIAGNOSIS — U099 Post covid-19 condition, unspecified: Secondary | ICD-10-CM | POA: Diagnosis not present

## 2021-10-09 DIAGNOSIS — R42 Dizziness and giddiness: Secondary | ICD-10-CM | POA: Diagnosis not present

## 2021-10-09 DIAGNOSIS — I10 Essential (primary) hypertension: Secondary | ICD-10-CM | POA: Diagnosis not present

## 2021-10-10 ENCOUNTER — Telehealth: Payer: Self-pay | Admitting: Internal Medicine

## 2021-10-10 NOTE — Telephone Encounter (Signed)
Patient needs losartain - please send to [facility]

## 2021-10-11 ENCOUNTER — Other Ambulatory Visit: Payer: Self-pay

## 2021-10-11 MED ORDER — LOSARTAN POTASSIUM 50 MG PO TABS
50.0000 mg | ORAL_TABLET | Freq: Every day | ORAL | 4 refills | Status: DC
Start: 2021-10-11 — End: 2022-03-08

## 2021-10-13 ENCOUNTER — Other Ambulatory Visit: Payer: Self-pay | Admitting: Internal Medicine

## 2021-10-13 NOTE — Telephone Encounter (Signed)
Please refill as per office routine med refill policy (all routine meds to be refilled for 3 mo or monthly (per pt preference) up to one year from last visit, then month to month grace period for 3 mo, then further med refills will have to be denied) ? ?

## 2021-10-15 ENCOUNTER — Other Ambulatory Visit: Payer: Self-pay | Admitting: Internal Medicine

## 2021-10-15 ENCOUNTER — Ambulatory Visit (INDEPENDENT_AMBULATORY_CARE_PROVIDER_SITE_OTHER): Payer: BC Managed Care – PPO | Admitting: Internal Medicine

## 2021-10-15 VITALS — BP 126/78 | HR 76 | Temp 98.3°F | Ht 68.0 in | Wt 161.0 lb

## 2021-10-15 DIAGNOSIS — I10 Essential (primary) hypertension: Secondary | ICD-10-CM

## 2021-10-15 DIAGNOSIS — E7800 Pure hypercholesterolemia, unspecified: Secondary | ICD-10-CM

## 2021-10-15 DIAGNOSIS — E871 Hypo-osmolality and hyponatremia: Secondary | ICD-10-CM | POA: Diagnosis not present

## 2021-10-15 DIAGNOSIS — I951 Orthostatic hypotension: Secondary | ICD-10-CM | POA: Diagnosis not present

## 2021-10-15 DIAGNOSIS — Z114 Encounter for screening for human immunodeficiency virus [HIV]: Secondary | ICD-10-CM | POA: Diagnosis not present

## 2021-10-15 LAB — HEPATIC FUNCTION PANEL
ALT: 21 U/L (ref 0–53)
AST: 19 U/L (ref 0–37)
Albumin: 4.4 g/dL (ref 3.5–5.2)
Alkaline Phosphatase: 52 U/L (ref 39–117)
Bilirubin, Direct: 0.1 mg/dL (ref 0.0–0.3)
Total Bilirubin: 0.5 mg/dL (ref 0.2–1.2)
Total Protein: 7.3 g/dL (ref 6.0–8.3)

## 2021-10-15 LAB — CBC WITH DIFFERENTIAL/PLATELET
Basophils Absolute: 0 10*3/uL (ref 0.0–0.1)
Basophils Relative: 0.6 % (ref 0.0–3.0)
Eosinophils Absolute: 0.1 10*3/uL (ref 0.0–0.7)
Eosinophils Relative: 1.9 % (ref 0.0–5.0)
HCT: 42.4 % (ref 39.0–52.0)
Hemoglobin: 14.7 g/dL (ref 13.0–17.0)
Lymphocytes Relative: 28.6 % (ref 12.0–46.0)
Lymphs Abs: 1.7 10*3/uL (ref 0.7–4.0)
MCHC: 34.8 g/dL (ref 30.0–36.0)
MCV: 94.8 fl (ref 78.0–100.0)
Monocytes Absolute: 0.7 10*3/uL (ref 0.1–1.0)
Monocytes Relative: 12.7 % — ABNORMAL HIGH (ref 3.0–12.0)
Neutro Abs: 3.2 10*3/uL (ref 1.4–7.7)
Neutrophils Relative %: 56.2 % (ref 43.0–77.0)
Platelets: 301 10*3/uL (ref 150.0–400.0)
RBC: 4.47 Mil/uL (ref 4.22–5.81)
RDW: 13.2 % (ref 11.5–15.5)
WBC: 5.8 10*3/uL (ref 4.0–10.5)

## 2021-10-15 LAB — BASIC METABOLIC PANEL
BUN: 9 mg/dL (ref 6–23)
CO2: 25 mEq/L (ref 19–32)
Calcium: 9.5 mg/dL (ref 8.4–10.5)
Chloride: 95 mEq/L — ABNORMAL LOW (ref 96–112)
Creatinine, Ser: 0.85 mg/dL (ref 0.40–1.50)
GFR: 94.85 mL/min (ref >60.00)
Glucose, Bld: 86 mg/dL (ref 70–99)
Potassium: 4.1 mEq/L (ref 3.5–5.1)
Sodium: 128 mEq/L — ABNORMAL LOW (ref 135–145)

## 2021-10-15 LAB — URINALYSIS, ROUTINE W REFLEX MICROSCOPIC
Bilirubin Urine: NEGATIVE
Hgb urine dipstick: NEGATIVE
Ketones, ur: NEGATIVE
Leukocytes,Ua: NEGATIVE
Nitrite: NEGATIVE
Specific Gravity, Urine: 1.01 (ref 1.000–1.030)
Total Protein, Urine: NEGATIVE
Urine Glucose: NEGATIVE
Urobilinogen, UA: 0.2 (ref 0.0–1.0)
pH: 6.5 (ref 5.0–8.0)

## 2021-10-15 NOTE — Patient Instructions (Signed)
Please drink plenty of fluids such as Gatorade or water in the next week  Your FMLA form will be done for this week (continuous leave) from sept 11 to sept 20  Please make an appt early next week if not improved, for renewal of the FMLA form and possible cardiology referral  Please continue all other medications as before, and refills have been done if requested.  Please have the pharmacy call with any other refills you may need.  Please continue your efforts at being more active, low cholesterol diet, and weight control.  Please keep your appointments with your specialists as you may have planned  Please go to the LAB at the blood drawing area for the tests to be done  You will be contacted by phone if any changes need to be made immediately.  Otherwise, you will receive a letter about your results with an explanation, but please check with MyChart first.  Please remember to sign up for MyChart if you have not done so, as this will be important to you in the future with finding out test results, communicating by private email, and scheduling acute appointments online when needed.

## 2021-10-15 NOTE — Progress Notes (Unsigned)
Patient ID: Scott Walker, male   DOB: 09/11/1960, 61 y.o.   MRN: [id number]        Chief Complaint: follow up post covid infection x 3 wks with reduce po intake       HPI:  Scott Walker is a 61 y.o. male here with c/o recent covid infection, lower po intake and recurrent dizziness with standing, unable to function at work.  Saw UC with sodium 130 - now drinking liquid IV, and prescribed clonidine 0.1 mg bid prn elevated BP, and amldoipine neither of which he has taken.  Pt denies chest pain, increased sob or doe, wheezing, orthopnea, PND, increased LE swelling, palpitations, or syncope, and no falls.  Denies worsening reflux, abd pain, dysphagia, n/v, bowel change or blood, though has had reduce po intake overall.    Wt Readings from Last 3 Encounters:  10/15/21 161 lb (73 kg)  06/22/21 161 lb 12.8 oz (73.4 kg)  06/06/20 158 lb 6.4 oz (71.8 kg)   BP Readings from Last 3 Encounters:  10/15/21 126/78  06/22/21 128/76  06/06/20 138/72         Past Medical History:  Diagnosis Date   ADD 02/24/2008   ALLERGIC RHINITIS 11/18/2007   Allergy    ARM PAIN, LEFT 02/24/2008   BENIGN PROSTATIC HYPERTROPHY 11/18/2007   GERD (gastroesophageal reflux disease)    recently-tried pantoprazole with help   HYPERLIPIDEMIA 11/18/2007   INSOMNIA-SLEEP DISORDER-UNSPEC 10/25/2009   LOW BACK PAIN 11/18/2007   PROSTATITIS, ACUTE 02/24/2008   Routine general medical examination at a health care facility 04/26/2010   SINUSITIS- ACUTE-NOS 12/28/2008   Special screening for malignant neoplasms, ovary 04/26/2010   Past Surgical History:  Procedure Laterality Date   SKIN CANCER EXCISION     left nose area   VASECTOMY     WISDOM TOOTH EXTRACTION      reports that he has quit smoking. His smoking use included cigars. He has never used smokeless tobacco. He reports current alcohol use of about 6.0 standard drinks of alcohol per week. He reports that he does not use drugs. family history includes  Hyperlipidemia in an other family member. Allergies  Allergen Reactions   Statins Other (See Comments)    "Makes me feel weird"   Current Outpatient Medications on File Prior to Visit  Medication Sig Dispense Refill   finasteride (PROSCAR) 5 MG tablet Take 1 tablet (5 mg total) by mouth daily. 90 tablet 2   losartan (COZAAR) 50 MG tablet Take 1 tablet (50 mg total) by mouth daily. 30 tablet 4   traMADol (ULTRAM) 50 MG tablet Take 1 tablet (50 mg total) by mouth every 6 (six) hours as needed. 120 tablet 2   zolpidem (AMBIEN) 10 MG tablet TAKE ONE TABLET BY MOUTH AT BEDTIME AS NEEDED SLEEP 90 tablet 1   No current facility-administered medications on file prior to visit.        ROS:  All others reviewed and negative.  Objective        PE:  BP 126/78 (BP Location: Right Arm, Patient Position: Sitting, Cuff Size: Large)   Pulse 76   Temp 98.3 F (36.8 C) (Oral)   Ht '5\' 8"'$  (1.727 m)   Wt 161 lb (73 kg)   SpO2 97%   BMI 24.48 kg/m                 Constitutional: Pt appears in NAD; + orthostatics noted  HENT: Head: NCAT.                Right Ear: External ear normal.                 Left Ear: External ear normal.                Eyes: . Pupils are equal, round, and reactive to light. Conjunctivae and EOM are normal               Nose: without d/c or deformity               Neck: Neck supple. Gross normal ROM               Cardiovascular: Normal rate and regular rhythm.                 Pulmonary/Chest: Effort normal and breath sounds without rales or wheezing.                Abd:  Soft, NT, ND, + BS, no organomegaly               Neurological: Pt is alert. At baseline orientation, motor grossly intact               Skin: Skin is warm. No rashes, no other new lesions, LE edema - none               Psychiatric: Pt behavior is normal without agitation   Micro: none  Cardiac tracings I have personally interpreted today:  none  Pertinent Radiological findings  (summarize): none   Lab Results  Component Value Date   WBC 5.8 10/15/2021   HGB 14.7 10/15/2021   HCT 42.4 10/15/2021   PLT 301.0 10/15/2021   GLUCOSE 86 10/15/2021   CHOL 174 06/22/2021   TRIG 68.0 06/22/2021   HDL 50.50 06/22/2021   LDLDIRECT 158.0 12/23/2008   LDLCALC 109 (H) 06/22/2021   ALT 21 10/15/2021   AST 19 10/15/2021   NA 128 (L) 10/15/2021   K 4.1 10/15/2021   CL 95 (L) 10/15/2021   CREATININE 0.85 10/15/2021   BUN 9 10/15/2021   CO2 25 10/15/2021   TSH 1.35 06/22/2021   PSA 0.30 06/22/2021   HGBA1C 5.5 06/22/2021   Assessment/Plan:  Scott Walker is a 61 y.o. White or Caucasian [1] male with  has a past medical history of ADD (02/24/2008), ALLERGIC RHINITIS (11/18/2007), Allergy, ARM PAIN, LEFT (02/24/2008), BENIGN PROSTATIC HYPERTROPHY (11/18/2007), GERD (gastroesophageal reflux disease), HYPERLIPIDEMIA (11/18/2007), INSOMNIA-SLEEP DISORDER-UNSPEC (10/25/2009), LOW BACK PAIN (11/18/2007), PROSTATITIS, ACUTE (02/24/2008), Routine general medical examination at a health care facility (04/26/2010), SINUSITIS- ACUTE-NOS (12/28/2008), and Special screening for malignant neoplasms, ovary (04/26/2010).  Essential hypertension BP Readings from Last 3 Encounters:  10/15/21 126/78  06/22/21 128/76  06/06/20 138/72   Stable, pt to continue medical treatment losartan 50 mg qd, ok to d/c clonidine and amlodipine   HLD (hyperlipidemia) Lab Results  Component Value Date   LDLCALC 109 (H) 06/22/2021   uncontrolled, pt to continue lower chol diet, declines statin for now   Orthostasis With recent mild low sodium post covid infection - hopefully transient phenomenon with lower po intake recent, now for increased po fluids, FMLA to be done out of work sept 11 2023 to return to work date of sept 20 2023; if not improved may need to r/o POTs related to recent covid infection  Hyponatremia Likely hypotonic hypovolemic type- for increase fluids such  as gatarade, f/u lab  today  Followup: Return if symptoms worsen or fail to improve.  [name], MD 10/16/2021 8:17 PM [facility]

## 2021-10-16 ENCOUNTER — Encounter: Payer: Self-pay | Admitting: Internal Medicine

## 2021-10-16 DIAGNOSIS — E871 Hypo-osmolality and hyponatremia: Secondary | ICD-10-CM | POA: Insufficient documentation

## 2021-10-16 DIAGNOSIS — I951 Orthostatic hypotension: Secondary | ICD-10-CM | POA: Insufficient documentation

## 2021-10-16 DIAGNOSIS — D485 Neoplasm of uncertain behavior of skin: Secondary | ICD-10-CM | POA: Diagnosis not present

## 2021-10-16 DIAGNOSIS — C4441 Basal cell carcinoma of skin of scalp and neck: Secondary | ICD-10-CM | POA: Diagnosis not present

## 2021-10-16 DIAGNOSIS — D1801 Hemangioma of skin and subcutaneous tissue: Secondary | ICD-10-CM | POA: Diagnosis not present

## 2021-10-16 DIAGNOSIS — L814 Other melanin hyperpigmentation: Secondary | ICD-10-CM | POA: Diagnosis not present

## 2021-10-16 DIAGNOSIS — L821 Other seborrheic keratosis: Secondary | ICD-10-CM | POA: Diagnosis not present

## 2021-10-16 DIAGNOSIS — S80861A Insect bite (nonvenomous), right lower leg, initial encounter: Secondary | ICD-10-CM | POA: Diagnosis not present

## 2021-10-16 LAB — HIV ANTIBODY (ROUTINE TESTING W REFLEX): HIV 1&2 Ab, 4th Generation: NONREACTIVE

## 2021-10-16 NOTE — Assessment & Plan Note (Signed)
BP Readings from Last 3 Encounters:  10/15/21 126/78  06/22/21 128/76  06/06/20 138/72   Stable, pt to continue medical treatment losartan 50 mg qd, ok to d/c clonidine and amlodipine

## 2021-10-16 NOTE — Assessment & Plan Note (Addendum)
Likely hypotonic hypovolemic type- for increase fluids such as gatarade, f/u lab today

## 2021-10-16 NOTE — Assessment & Plan Note (Signed)
Lab Results  Component Value Date   LDLCALC 109 (H) 06/22/2021   uncontrolled, pt to continue lower chol diet, declines statin for now

## 2021-10-16 NOTE — Assessment & Plan Note (Addendum)
With recent mild low sodium post covid infection - hopefully transient phenomenon with lower po intake recent, now for increased po fluids, FMLA to be done out of work sept 11 2023 to return to work date of sept 20 2023; if not improved may need to r/o POTs related to recent covid infection

## 2021-10-23 ENCOUNTER — Ambulatory Visit (INDEPENDENT_AMBULATORY_CARE_PROVIDER_SITE_OTHER): Payer: BC Managed Care – PPO | Admitting: Internal Medicine

## 2021-10-23 VITALS — BP 120/70 | HR 99 | Temp 98.0°F | Ht 68.0 in | Wt 162.0 lb

## 2021-10-23 DIAGNOSIS — E871 Hypo-osmolality and hyponatremia: Secondary | ICD-10-CM

## 2021-10-23 DIAGNOSIS — R42 Dizziness and giddiness: Secondary | ICD-10-CM | POA: Diagnosis not present

## 2021-10-23 DIAGNOSIS — F988 Other specified behavioral and emotional disorders with onset usually occurring in childhood and adolescence: Secondary | ICD-10-CM | POA: Diagnosis not present

## 2021-10-23 LAB — URINALYSIS, ROUTINE W REFLEX MICROSCOPIC
Bilirubin Urine: NEGATIVE
Hgb urine dipstick: NEGATIVE
Ketones, ur: NEGATIVE
Leukocytes,Ua: NEGATIVE
Nitrite: NEGATIVE
RBC / HPF: NONE SEEN (ref 0–?)
Specific Gravity, Urine: <=1.005 — AB (ref 1.000–1.030)
Total Protein, Urine: NEGATIVE
Urine Glucose: NEGATIVE
Urobilinogen, UA: 0.2 (ref 0.0–1.0)
pH: 7.5 (ref 5.0–8.0)

## 2021-10-23 LAB — BASIC METABOLIC PANEL
BUN: 7 mg/dL (ref 6–23)
CO2: 28 mEq/L (ref 19–32)
Calcium: 9.9 mg/dL (ref 8.4–10.5)
Chloride: 98 mEq/L (ref 96–112)
Creatinine, Ser: 0.84 mg/dL (ref 0.40–1.50)
GFR: 95.18 mL/min (ref >60.00)
Glucose, Bld: 54 mg/dL — ABNORMAL LOW (ref 70–99)
Potassium: 4.4 mEq/L (ref 3.5–5.1)
Sodium: 131 mEq/L — ABNORMAL LOW (ref 135–145)

## 2021-10-23 MED ORDER — AMPHETAMINE-DEXTROAMPHETAMINE 10 MG PO TABS: 10.0000 mg | ORAL_TABLET | Freq: Two times a day (BID) | ORAL | 0 refills | Status: DC

## 2021-10-23 NOTE — Patient Instructions (Signed)
Please take all new medication as prescribed - the adderall as prescribed  Please continue all other medications as before, and refills have been done if requested.  Please have the pharmacy call with any other refills you may need.  Please continue your efforts at being more active, low cholesterol diet, and weight control.  Please keep your appointments with your specialists as you may have planned  Please go to the LAB at the blood drawing area for the tests to be done  You will be contacted by phone if any changes need to be made immediately.  Otherwise, you will receive a letter about your results with an explanation, but please check with MyChart first.  Please remember to sign up for MyChart if you have not done so, as this will be important to you in the future with finding out test results, communicating by private email, and scheduling acute appointments online when needed.  Please make an Appointment to return about sept 29 2023 for follow up prior to going back to work

## 2021-10-23 NOTE — Progress Notes (Unsigned)
Patient ID: Scott Walker, male   DOB: 02-Jun-1960, 61 y.o.   MRN: [id number]        Chief Complaint: follow up orthostasis and thirsty with frequent urination       HPI:  Scott Walker is a 61 y.o. male here to f/u recent orthostasis now improved it seems with increased po fluids, and not nearly as lightheaded on standing as last wk, but still has an element of airheadedness, still feels unable to go to work as he realizes now that he is drinking increased fluids, he is urinating more currently as compared to maybe a month ago.  Seems to be more frequent and more volume.  Denies urinary symptoms such as dysuria, urgency, flank pain, hematuria or n/v, fever, chills.Pt denies chest pain, increased sob or doe, wheezing, orthopnea, PND, increased LE swelling, palpitations, or syncope.  No fever, ,chills       Wt Readings from Last 3 Encounters:  10/23/21 162 lb (73.5 kg)  10/15/21 161 lb (73 kg)  06/22/21 161 lb 12.8 oz (73.4 kg)   BP Readings from Last 3 Encounters:  10/23/21 120/70  10/15/21 126/78  06/22/21 128/76         Past Medical History:  Diagnosis Date   ADD 02/24/2008   ALLERGIC RHINITIS 11/18/2007   Allergy    ARM PAIN, LEFT 02/24/2008   BENIGN PROSTATIC HYPERTROPHY 11/18/2007   GERD (gastroesophageal reflux disease)    recently-tried pantoprazole with help   HYPERLIPIDEMIA 11/18/2007   INSOMNIA-SLEEP DISORDER-UNSPEC 10/25/2009   LOW BACK PAIN 11/18/2007   PROSTATITIS, ACUTE 02/24/2008   Routine general medical examination at a health care facility 04/26/2010   SINUSITIS- ACUTE-NOS 12/28/2008   Special screening for malignant neoplasms, ovary 04/26/2010   Past Surgical History:  Procedure Laterality Date   SKIN CANCER EXCISION     left nose area   VASECTOMY     WISDOM TOOTH EXTRACTION      reports that he has quit smoking. His smoking use included cigars. He has never used smokeless tobacco. He reports current alcohol use of about 6.0 standard drinks of alcohol  per week. He reports that he does not use drugs. family history includes Hyperlipidemia in an other family member. Allergies  Allergen Reactions   Statins Other (See Comments)    "Makes me feel weird"   Current Outpatient Medications on File Prior to Visit  Medication Sig Dispense Refill   finasteride (PROSCAR) 5 MG tablet Take 1 tablet (5 mg total) by mouth daily. 90 tablet 2   losartan (COZAAR) 50 MG tablet Take 1 tablet (50 mg total) by mouth daily. 30 tablet 4   traMADol (ULTRAM) 50 MG tablet Take 1 tablet (50 mg total) by mouth every 6 (six) hours as needed. 120 tablet 2   zolpidem (AMBIEN) 10 MG tablet TAKE ONE TABLET BY MOUTH AT BEDTIME AS NEEDED SLEEP 90 tablet 1   No current facility-administered medications on file prior to visit.        ROS:  All others reviewed and negative.  Objective        PE:  BP 120/70 (BP Location: Left Arm, Patient Position: Sitting, Cuff Size: Normal)   Pulse 99   Temp 98 F (36.7 C) (Oral)   Ht '5\' 8"'$  (1.727 m)   Wt 162 lb (73.5 kg)   SpO2 98%   BMI 24.63 kg/m                 Constitutional: Pt appears  in NAD               HENT: Head: NCAT.                Right Ear: External ear normal.                 Left Ear: External ear normal.                Eyes: . Pupils are equal, round, and reactive to light. Conjunctivae and EOM are normal               Nose: without d/c or deformity               Neck: Neck supple. Gross normal ROM               Cardiovascular: Normal rate and regular rhythm.                 Pulmonary/Chest: Effort normal and breath sounds without rales or wheezing.                Abd:  Soft, NT, ND, + BS, no organomegaly               Neurological: Pt is alert. At baseline orientation, motor grossly intact               Skin: Skin is warm. No rashes, no other new lesions, LE edema - none               Psychiatric: Pt behavior is normal without agitation   Micro: none  Cardiac tracings I have personally interpreted today:   none  Pertinent Radiological findings (summarize): none   Lab Results  Component Value Date   WBC 5.8 10/15/2021   HGB 14.7 10/15/2021   HCT 42.4 10/15/2021   PLT 301.0 10/15/2021   GLUCOSE 54 (L) 10/23/2021   CHOL 174 06/22/2021   TRIG 68.0 06/22/2021   HDL 50.50 06/22/2021   LDLDIRECT 158.0 12/23/2008   LDLCALC 109 (H) 06/22/2021   ALT 21 10/15/2021   AST 19 10/15/2021   NA 131 (L) 10/23/2021   K 4.4 10/23/2021   CL 98 10/23/2021   CREATININE 0.84 10/23/2021   BUN 7 10/23/2021   CO2 28 10/23/2021   TSH 1.35 06/22/2021   PSA 0.30 06/22/2021   HGBA1C 5.5 06/22/2021   Assessment/Plan:  Scott Walker is a 61 y.o. White or Caucasian [1] male with  has a past medical history of ADD (02/24/2008), ALLERGIC RHINITIS (11/18/2007), Allergy, ARM PAIN, LEFT (02/24/2008), BENIGN PROSTATIC HYPERTROPHY (11/18/2007), GERD (gastroesophageal reflux disease), HYPERLIPIDEMIA (11/18/2007), INSOMNIA-SLEEP DISORDER-UNSPEC (10/25/2009), LOW BACK PAIN (11/18/2007), PROSTATITIS, ACUTE (02/24/2008), Routine general medical examination at a health care facility (04/26/2010), SINUSITIS- ACUTE-NOS (12/28/2008), and Special screening for malignant neoplasms, ovary (04/26/2010).  Dizziness Improved no longer orthostatic today  Hyponatremia Given hx cant r/o diabetes insipidus vs other - for urine studies, consider renal , endo referral; for FMLA from tentative sept 11 to sept 20 2023  Attention deficit disorder Stable overall, cont adderall  Followup: Return in about 10 days (around 11/02/2021).  [name], MD 10/25/2021 8:59 PM [facility]

## 2021-10-24 LAB — OSMOLALITY, URINE: Osmolality, Ur: 130 mOsm/kg (ref 50–1200)

## 2021-10-24 LAB — POTASSIUM, URINE, RANDOM: Potassium Urine: 20 mmol/L (ref 12–129)

## 2021-10-24 LAB — OSMOLALITY: Osmolality: 268 mOsm/kg — ABNORMAL LOW (ref 278–305)

## 2021-10-24 LAB — SODIUM, URINE, RANDOM: Sodium, Ur: 20 mmol/L — ABNORMAL LOW (ref 28–272)

## 2021-10-25 ENCOUNTER — Encounter: Payer: Self-pay | Admitting: Internal Medicine

## 2021-10-25 ENCOUNTER — Other Ambulatory Visit: Payer: Self-pay | Admitting: Internal Medicine

## 2021-10-25 ENCOUNTER — Telehealth: Payer: Self-pay

## 2021-10-25 NOTE — Assessment & Plan Note (Signed)
Improved no longer orthostatic today

## 2021-10-25 NOTE — Telephone Encounter (Signed)
The plan will fax a determination, typically within 1 to 5 business days.

## 2021-10-25 NOTE — Assessment & Plan Note (Signed)
Stable overall, cont adderall

## 2021-10-25 NOTE — Assessment & Plan Note (Addendum)
Given hx cant r/o diabetes insipidus vs other - for urine studies, consider renal , endo referral; for FMLA from tentative sept 11 to sept 20 2023

## 2021-10-25 NOTE — Telephone Encounter (Signed)
PA started   Key: [facility]

## 2021-10-29 ENCOUNTER — Telehealth: Payer: Self-pay

## 2021-10-29 NOTE — Telephone Encounter (Signed)
Patient is calling in stating he received a cup to do a 24 hour urine test, is confused on the instructions. Wondering if he can get a call back with clarification.

## 2021-10-31 NOTE — Telephone Encounter (Signed)
Please advise and I will update and inform the patient.

## 2021-10-31 NOTE — Telephone Encounter (Signed)
I think the idea was to collect urine for total 24 hrs from waking up the first day, then ending with the first urination of the second day; we are looking for the Volume which if high can mean diabetes insipidus I mentioned at his last visit

## 2021-11-01 ENCOUNTER — Telehealth: Payer: Self-pay | Admitting: Internal Medicine

## 2021-11-01 LAB — SPECIMEN STATUS REPORT

## 2021-11-01 NOTE — Telephone Encounter (Signed)
[place] for lab called 24 hr urine for creatinine  Dx code: E87.1, R35.0  (hyponatremia, and urinary frequency)

## 2021-11-01 NOTE — Telephone Encounter (Signed)
[facility] received a lab test specimen  with no test indicated. What test is needed for this specimen.

## 2021-11-01 NOTE — Telephone Encounter (Signed)
Please call [facility] concerning this patient.

## 2021-11-02 ENCOUNTER — Telehealth: Payer: Self-pay | Admitting: Internal Medicine

## 2021-11-02 ENCOUNTER — Ambulatory Visit (INDEPENDENT_AMBULATORY_CARE_PROVIDER_SITE_OTHER): Payer: BC Managed Care – PPO | Admitting: Internal Medicine

## 2021-11-02 VITALS — BP 124/84 | HR 89 | Ht 68.0 in | Wt 162.0 lb

## 2021-11-02 DIAGNOSIS — E232 Diabetes insipidus: Secondary | ICD-10-CM | POA: Insufficient documentation

## 2021-11-02 DIAGNOSIS — I951 Orthostatic hypotension: Secondary | ICD-10-CM | POA: Diagnosis not present

## 2021-11-02 DIAGNOSIS — I10 Essential (primary) hypertension: Secondary | ICD-10-CM

## 2021-11-02 LAB — CREATINE, 24-HOUR URINE

## 2021-11-02 NOTE — Assessment & Plan Note (Signed)
O/w stable, ok for losartan 50 qd

## 2021-11-02 NOTE — Patient Instructions (Signed)
Please continue all other medications as before  Please have the pharmacy call with any other refills you may need.  Please continue your efforts at being more active, low cholesterol diet, and weight control.  Please keep your appointments with your specialists as you may have planned  Your FMLA form should be filled out today  You will be contacted regarding the referral for: urgent to Endocrinology  Please make an Appointment to return in 3 months, or sooner if needed

## 2021-11-02 NOTE — Assessment & Plan Note (Signed)
Improved symptoms not that he is keeping up with fluids,

## 2021-11-02 NOTE — Assessment & Plan Note (Signed)
High suspicion for this; for endo referral as may need water deprivation testing and tx

## 2021-11-02 NOTE — Telephone Encounter (Signed)
[facility] states that they need the test number.

## 2021-11-02 NOTE — Progress Notes (Signed)
Patient ID: Scott Walker, male   DOB: 01/25/1961, 61 y.o.   MRN: [id number]        Chief Complaint: follow up polyuria       HPI:  Scott Walker is a 61 y.o. male here overall some improved but still with lightheaded and cognitive worsening, unable to sit up to work (at home) more than 2-4 hours per day.  Needs FMLA done.  Urine remains dilute per pt, pt states 3L urine was turned in to lab for the 24u cr but test unable to be run.  Recent urine lytes, serum/urine osm seem c/w dilute urine.        Wt Readings from Last 3 Encounters:  11/02/21 162 lb (73.5 kg)  10/23/21 162 lb (73.5 kg)  10/15/21 161 lb (73 kg)   BP Readings from Last 3 Encounters:  11/02/21 124/84  10/23/21 120/70  10/15/21 126/78         Past Medical History:  Diagnosis Date   ADD 02/24/2008   ALLERGIC RHINITIS 11/18/2007   Allergy    ARM PAIN, LEFT 02/24/2008   BENIGN PROSTATIC HYPERTROPHY 11/18/2007   GERD (gastroesophageal reflux disease)    recently-tried pantoprazole with help   HYPERLIPIDEMIA 11/18/2007   INSOMNIA-SLEEP DISORDER-UNSPEC 10/25/2009   LOW BACK PAIN 11/18/2007   PROSTATITIS, ACUTE 02/24/2008   Routine general medical examination at a health care facility 04/26/2010   SINUSITIS- ACUTE-NOS 12/28/2008   Special screening for malignant neoplasms, ovary 04/26/2010   Past Surgical History:  Procedure Laterality Date   SKIN CANCER EXCISION     left nose area   VASECTOMY     WISDOM TOOTH EXTRACTION      reports that he has quit smoking. His smoking use included cigars. He has never used smokeless tobacco. He reports current alcohol use of about 6.0 standard drinks of alcohol per week. He reports that he does not use drugs. family history includes Hyperlipidemia in an other family member. Allergies  Allergen Reactions   Statins Other (See Comments)    "Makes me feel weird"   Current Outpatient Medications on File Prior to Visit  Medication Sig Dispense Refill    amphetamine-dextroamphetamine (ADDERALL) 10 MG tablet Take 1 tablet (10 mg total) by mouth 2 (two) times daily. 60 tablet 0   finasteride (PROSCAR) 5 MG tablet Take 1 tablet (5 mg total) by mouth daily. 90 tablet 2   losartan (COZAAR) 50 MG tablet Take 1 tablet (50 mg total) by mouth daily. 30 tablet 4   traMADol (ULTRAM) 50 MG tablet Take 1 tablet (50 mg total) by mouth every 6 (six) hours as needed. 120 tablet 2   zolpidem (AMBIEN) 10 MG tablet TAKE ONE TABLET BY MOUTH AT BEDTIME AS NEEDED SLEEP 90 tablet 1   No current facility-administered medications on file prior to visit.        ROS:  All others reviewed and negative.  Objective        PE:  BP 124/84 (BP Location: Left Arm, Patient Position: Standing, Cuff Size: Large)   Pulse 89   Ht '5\' 8"'$  (1.727 m)   Wt 162 lb (73.5 kg)   SpO2 94%   BMI 24.63 kg/m                 Constitutional: Pt appears in NAD               HENT: Head: NCAT.  Right Ear: External ear normal.                 Left Ear: External ear normal.                Eyes: . Pupils are equal, round, and reactive to light. Conjunctivae and EOM are normal               Nose: without d/c or deformity               Neck: Neck supple. Gross normal ROM               Cardiovascular: Normal rate and regular rhythm.                 Pulmonary/Chest: Effort normal and breath sounds without rales or wheezing.                Abd:  Soft, NT, ND, + BS, no organomegaly               Neurological: Pt is alert. At baseline orientation, motor grossly intact               Skin: Skin is warm. No rashes, no other new lesions, LE edema - none               Psychiatric: Pt behavior is normal without agitation   Micro: none  Cardiac tracings I have personally interpreted today:  none  Pertinent Radiological findings (summarize): none   Lab Results  Component Value Date   WBC 5.8 10/15/2021   HGB 14.7 10/15/2021   HCT 42.4 10/15/2021   PLT 301.0 10/15/2021   GLUCOSE 54  (L) 10/23/2021   CHOL 174 06/22/2021   TRIG 68.0 06/22/2021   HDL 50.50 06/22/2021   LDLDIRECT 158.0 12/23/2008   LDLCALC 109 (H) 06/22/2021   ALT 21 10/15/2021   AST 19 10/15/2021   NA 131 (L) 10/23/2021   K 4.4 10/23/2021   CL 98 10/23/2021   CREATININE 0.84 10/23/2021   BUN 7 10/23/2021   CO2 28 10/23/2021   TSH 1.35 06/22/2021   PSA 0.30 06/22/2021   HGBA1C 5.5 06/22/2021   Assessment/Plan:  Scott Walker is a 61 y.o. White or Caucasian [1] male with  has a past medical history of ADD (02/24/2008), ALLERGIC RHINITIS (11/18/2007), Allergy, ARM PAIN, LEFT (02/24/2008), BENIGN PROSTATIC HYPERTROPHY (11/18/2007), GERD (gastroesophageal reflux disease), HYPERLIPIDEMIA (11/18/2007), INSOMNIA-SLEEP DISORDER-UNSPEC (10/25/2009), LOW BACK PAIN (11/18/2007), PROSTATITIS, ACUTE (02/24/2008), Routine general medical examination at a health care facility (04/26/2010), SINUSITIS- ACUTE-NOS (12/28/2008), and Special screening for malignant neoplasms, ovary (04/26/2010).  Diabetes insipidus ([place]) High suspicion for this; for endo referral as may need water deprivation testing and tx  Orthostasis Improved symptoms not that he is keeping up with fluids,  Essential hypertension O/w stable, ok for losartan 50 qd  Followup: Return in about 3 months (around 02/01/2022).  [name], MD 11/02/2021 1:04 PM [facility]

## 2021-11-02 NOTE — Telephone Encounter (Signed)
I would not have any idea what that means; please forward to the [facility] lab person

## 2021-11-02 NOTE — Telephone Encounter (Signed)
Patient wants to be sure that his fmla paperwork got faxed in today.  Please call patient and let him know.

## 2021-11-05 ENCOUNTER — Telehealth: Payer: Self-pay

## 2021-11-05 DIAGNOSIS — Z0289 Encounter for other administrative examinations: Secondary | ICD-10-CM

## 2021-11-05 NOTE — Telephone Encounter (Signed)
Pt FMLA Disability forms are completed and faxed. Left detail voice mail for pt and also that there is a copy at the front to pick up

## 2021-12-03 ENCOUNTER — Telehealth: Payer: Self-pay | Admitting: "Endocrinology

## 2021-12-04 ENCOUNTER — Ambulatory Visit: Payer: Self-pay | Admitting: Nurse Practitioner

## 2021-12-06 ENCOUNTER — Encounter: Payer: Self-pay | Admitting: "Endocrinology

## 2021-12-06 ENCOUNTER — Ambulatory Visit (INDEPENDENT_AMBULATORY_CARE_PROVIDER_SITE_OTHER): Payer: BC Managed Care – PPO | Admitting: "Endocrinology

## 2021-12-06 VITALS — BP 128/80 | HR 82 | Ht 68.0 in | Wt 166.0 lb

## 2021-12-06 DIAGNOSIS — E871 Hypo-osmolality and hyponatremia: Secondary | ICD-10-CM

## 2021-12-06 DIAGNOSIS — E782 Mixed hyperlipidemia: Secondary | ICD-10-CM | POA: Diagnosis not present

## 2021-12-06 DIAGNOSIS — E538 Deficiency of other specified B group vitamins: Secondary | ICD-10-CM | POA: Diagnosis not present

## 2021-12-06 NOTE — Progress Notes (Signed)
Endocrinology Consult Note                                            12/06/2021, 6:47 PM   Subjective:    Patient ID: Scott Walker, male    DOB: May 25, 1960, PCP Biagio Borg, MD   Past Medical History:  Diagnosis Date   ADD 02/24/2008   ALLERGIC RHINITIS 11/18/2007   Allergy    ARM PAIN, LEFT 02/24/2008   BENIGN PROSTATIC HYPERTROPHY 11/18/2007   GERD (gastroesophageal reflux disease)    recently-tried pantoprazole with help   HYPERLIPIDEMIA 11/18/2007   INSOMNIA-SLEEP DISORDER-UNSPEC 10/25/2009   LOW BACK PAIN 11/18/2007   PROSTATITIS, ACUTE 02/24/2008   Routine general medical examination at a health care facility 04/26/2010   SINUSITIS- ACUTE-NOS 12/28/2008   Special screening for malignant neoplasms, ovary 04/26/2010   Past Surgical History:  Procedure Laterality Date   SKIN CANCER EXCISION     left nose area   VASECTOMY     WISDOM TOOTH EXTRACTION     Social History   Socioeconomic History   Marital status: Married    Spouse name: Not on file   Number of children: 3   Years of education: Not on file   Highest education level: Not on file  Occupational History   Occupation: Art gallery manager  Tobacco Use   Smoking status: Former    Types: Cigars   Smokeless tobacco: Never   Tobacco comments:    occasional cigar  Substance and Sexual Activity   Alcohol use: Yes    Alcohol/week: 6.0 standard drinks of alcohol    Types: 6 Standard drinks or equivalent per week    Comment: moderate   Drug use: No   Sexual activity: Not on file  Other Topics Concern   Not on file  Social History Narrative   Not on file   Social Determinants of Health   Financial Resource Strain: Not on file  Food Insecurity: Not on file  Transportation Needs: Not on file  Physical Activity: Not on file  Stress: Not on file  Social Connections: Not on file   Family History  Problem Relation Age of Onset   Hyperlipidemia Other    Colon cancer Neg Hx    Colon polyps Neg  Hx    Outpatient Encounter Medications as of 12/06/2021  Medication Sig   vitamin B-12 (CYANOCOBALAMIN) 250 MCG tablet Take 250 mcg by mouth daily.   finasteride (PROSCAR) 5 MG tablet Take 1 tablet (5 mg total) by mouth daily.   losartan (COZAAR) 50 MG tablet Take 1 tablet (50 mg total) by mouth daily.   traMADol (ULTRAM) 50 MG tablet Take 1 tablet (50 mg total) by mouth every 6 (six) hours as needed.   zolpidem (AMBIEN) 10 MG tablet TAKE ONE TABLET BY MOUTH AT BEDTIME AS NEEDED SLEEP   [DISCONTINUED] amphetamine-dextroamphetamine (ADDERALL) 10 MG tablet Take 1 tablet (10 mg total) by mouth 2 (two) times daily.   No facility-administered encounter medications on file as of 12/06/2021.   ALLERGIES: Allergies  Allergen Reactions   Statins Other (See Comments)    "Makes me feel weird"    VACCINATION STATUS: Immunization History  Administered Date(s) Administered   Td 12/28/2008   Zoster, Live 03/28/2015    HPI Scott Walker is 61 y.o. male who presents today with a medical  history as above. he is being seen in consultation for hyponatremia requested by Biagio Borg, MD.   History was obtained directly from the patient as well as chart review. He denies and his records did not show hyponatremia.  Other, his most recent labs showing mild to moderate hyponatremia.  Patient describes polydipsia, and polyuria, however, maximum estimate daily urine output as less than 2 L. He denies any prior history of head injury, did not have any prior history of diabetes insipidus. He is a former smoker.  His medical history includes hypertension, BPH, ADD. He also reports intermittent dizziness, denies syncope/presyncope. He does report mildly fluctuating body weight, no major recent changes.  Review of Systems  Constitutional: + Mildly fluctuating body weight, + fatigue, no subjective hyperthermia, no subjective hypothermia Eyes: no blurry vision, no xerophthalmia ENT: no sore throat, no  nodules palpated in throat, no dysphagia/odynophagia, no hoarseness Cardiovascular: no Chest Pain, no Shortness of Breath, no palpitations, no leg swelling Respiratory: no cough, no shortness of breath Gastrointestinal: no Nausea/Vomiting/Diarhhea Musculoskeletal: no muscle/joint aches Skin: no rashes Neurological: no tremors, no numbness, no tingling, no dizziness Psychiatric: no depression, no anxiety  Objective:       12/06/2021    9:39 AM 11/02/2021   11:03 AM 11/02/2021   10:58 AM  Vitals with BMI  Height '5\' 8"'$     Weight 166 lbs    BMI 14.78    Systolic 295 621 308  Diastolic 80 84 84  Pulse 82      BP 128/80   Pulse 82   Ht '5\' 8"'$  (1.727 m)   Wt 166 lb (75.3 kg)   BMI 25.24 kg/m   Wt Readings from Last 3 Encounters:  12/06/21 166 lb (75.3 kg)  11/02/21 162 lb (73.5 kg)  10/23/21 162 lb (73.5 kg)    Physical Exam  Constitutional:  Body mass index is 25.24 kg/m.,  not in acute distress, normal state of mind Eyes: PERRLA, EOMI, no exophthalmos ENT: moist mucous membranes, no gross thyromegaly, no gross cervical lymphadenopathy Cardiovascular: normal precordial activity, Regular Rate and Rhythm, no Murmur/Rubs/Gallops Respiratory:  adequate breathing efforts, no gross chest deformity, Clear to auscultation bilaterally Gastrointestinal: abdomen soft, Non -tender, No distension, Bowel Sounds present, no gross organomegaly Musculoskeletal: no gross deformities, strength intact in all four extremities Skin: moist, warm, no rashes Neurological: no tremor with outstretched hands, Deep tendon reflexes normal in bilateral lower extremities.  CMP ( most recent) CMP     Component Value Date/Time   NA 131 (L) 10/23/2021 1205   K 4.4 10/23/2021 1205   CL 98 10/23/2021 1205   CO2 28 10/23/2021 1205   GLUCOSE 54 (L) 10/23/2021 1205   BUN 7 10/23/2021 1205   CREATININE 0.84 10/23/2021 1205   CALCIUM 9.9 10/23/2021 1205   PROT 7.3 10/15/2021 1435   ALBUMIN 4.4 10/15/2021  1435   AST 19 10/15/2021 1435   ALT 21 10/15/2021 1435   ALKPHOS 52 10/15/2021 1435   BILITOT 0.5 10/15/2021 1435   GFRNONAA 85.10 12/23/2008 0953   GFRAA 84 11/13/2007 0838     Diabetic Labs (most recent): Lab Results  Component Value Date   HGBA1C 5.5 06/22/2021     Lipid Panel ( most recent) Lipid Panel     Component Value Date/Time   CHOL 174 06/22/2021 0834   TRIG 68.0 06/22/2021 0834   HDL 50.50 06/22/2021 0834   CHOLHDL 3 06/22/2021 0834   VLDL 13.6 06/22/2021 0834   LDLCALC 109 (  H) 06/22/2021 0834   LDLDIRECT 158.0 12/23/2008 0953      Lab Results  Component Value Date   TSH 1.35 06/22/2021   TSH 0.59 10/12/2014   TSH 0.99 07/13/2013   TSH 0.65 04/26/2010   TSH 0.80 12/23/2008   TSH 0.67 11/13/2007   TSH 0.51 03/18/2006         Assessment & Plan:   1. Hyponatremia 2. Mixed hyperlipidemia 3. Vitamin B12 deficiency  - Scott Walker  is being seen at a kind request of Biagio Borg, MD. - I have reviewed his available endocrine records and clinically evaluated the patient. - Based on these reviews, he has recent course of hyponatremia associated with hypo-osmolality, not sufficient for diagnosis of diabetes insipidus.  Patient is clinically euvolemic.  Urine sodium on October 23, 2021 was low at 20 millimoles per liter. 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 I advised him to restrict free water to half of what he is consuming currently.  Gross estimate is 2 L a day, advised to lowered to 1 L a day. He will not be initiated on medications.  He will have repeat CMP, serum osmolality, urine osmolality as well as uric acid and return in 2 weeks for reevaluation. Related to his dizziness, he will also be screened for adrenal sufficiency with a.m. cortisol. His records also show vitamin B12 deficiency.  He is advised to resume his over-the-counter vitamin B12 250 mcg p.o. daily.  For his dyslipidemia with an LDL of 109, he is advised to  switch to  more plant-based protein in lieu of meats, dairy and eggs.  He will need repeat lipid panel in 3-4 months.  He is not on statins at the moment.  - he is advised to maintain close follow up with Biagio Borg, MD for primary care needs.   - Time spent with the patient: 50 minutes, of which >50% was spent in  counseling him about his hyponatremia, hyperlipidemia, vitamin B12 deficiency and the rest in obtaining information about his symptoms, reviewing his previous labs/studies ( including abstractions from other facilities),  evaluations, and treatments,  and developing a plan to confirm diagnosis and long term treatment based on the latest standards of care/guidelines; and documenting his care.  Scott Walker participated in the discussions, expressed understanding, and voiced agreement with the above plans.  All questions were answered to his satisfaction. he is encouraged to contact clinic should he have any questions or concerns prior to his return visit.  Follow up plan: Return in about 2 weeks (around 12/20/2021) for Fasting Labs  in AM B4 8.   Glade Lloyd, MD [facility] [address] [place], [place] [address] Phone: [phone]  Fax: [phone]     12/06/2021, 6:47 PM  This note was partially dictated with voice recognition software. Similar sounding words can be transcribed inadequately or may not  be corrected upon review.

## 2021-12-12 DIAGNOSIS — E871 Hypo-osmolality and hyponatremia: Secondary | ICD-10-CM | POA: Diagnosis not present

## 2021-12-14 LAB — COMPREHENSIVE METABOLIC PANEL
ALT: 18 IU/L (ref 0–44)
AST: 20 IU/L (ref 0–40)
Albumin/Globulin Ratio: 2.5 — ABNORMAL HIGH (ref 1.2–2.2)
Albumin: 4.7 g/dL (ref 3.8–4.9)
Alkaline Phosphatase: 57 IU/L (ref 44–121)
BUN/Creatinine Ratio: 8 — ABNORMAL LOW (ref 10–24)
BUN: 7 mg/dL — ABNORMAL LOW (ref 8–27)
Bilirubin Total: 0.4 mg/dL (ref 0.0–1.2)
CO2: 21 mmol/L (ref 20–29)
Calcium: 9.3 mg/dL (ref 8.6–10.2)
Chloride: 101 mmol/L (ref 96–106)
Creatinine, Ser: 0.88 mg/dL (ref 0.76–1.27)
Globulin, Total: 1.9 g/dL (ref 1.5–4.5)
Glucose: 91 mg/dL (ref 70–99)
Potassium: 4.6 mmol/L (ref 3.5–5.2)
Sodium: 136 mmol/L (ref 134–144)
Total Protein: 6.6 g/dL (ref 6.0–8.5)
eGFR: 98 mL/min/{1.73_m2} (ref >59)

## 2021-12-14 LAB — OSMOLALITY: Osmolality Meas: 276 mOsmol/kg (ref 275–295)

## 2021-12-14 LAB — CORTISOL-AM, BLOOD: Cortisol - AM: 17.5 ug/dL (ref 6.2–19.4)

## 2021-12-14 LAB — URIC ACID: Uric Acid: 3.6 mg/dL — ABNORMAL LOW (ref 3.8–8.4)

## 2021-12-14 LAB — OSMOLALITY, URINE: Osmolality, Ur: 618 mOsmol/kg

## 2021-12-20 ENCOUNTER — Encounter: Payer: Self-pay | Admitting: "Endocrinology

## 2021-12-20 ENCOUNTER — Ambulatory Visit (INDEPENDENT_AMBULATORY_CARE_PROVIDER_SITE_OTHER): Payer: BC Managed Care – PPO | Admitting: "Endocrinology

## 2021-12-20 VITALS — BP 118/78 | HR 84 | Ht 68.0 in | Wt 163.0 lb

## 2021-12-20 DIAGNOSIS — E538 Deficiency of other specified B group vitamins: Secondary | ICD-10-CM | POA: Diagnosis not present

## 2021-12-20 DIAGNOSIS — E782 Mixed hyperlipidemia: Secondary | ICD-10-CM | POA: Diagnosis not present

## 2021-12-20 DIAGNOSIS — Z789 Other specified health status: Secondary | ICD-10-CM | POA: Insufficient documentation

## 2021-12-20 DIAGNOSIS — E871 Hypo-osmolality and hyponatremia: Secondary | ICD-10-CM

## 2021-12-20 NOTE — Telephone Encounter (Signed)
error 

## 2021-12-20 NOTE — Progress Notes (Signed)
12/20/2021, 9:22 AM  Endocrinology follow-up note   Subjective:    Patient ID: Scott Walker, male    DOB: 04-07-1960, PCP Scott Borg, Scott Walker   Past Medical History:  Diagnosis Date   ADD 02/24/2008   ALLERGIC RHINITIS 11/18/2007   Allergy    ARM PAIN, LEFT 02/24/2008   BENIGN PROSTATIC HYPERTROPHY 11/18/2007   GERD (gastroesophageal reflux disease)    recently-tried pantoprazole with help   HYPERLIPIDEMIA 11/18/2007   INSOMNIA-SLEEP DISORDER-UNSPEC 10/25/2009   LOW BACK PAIN 11/18/2007   PROSTATITIS, ACUTE 02/24/2008   Routine general medical examination at a health care facility 04/26/2010   SINUSITIS- ACUTE-NOS 12/28/2008   Special screening for malignant neoplasms, ovary 04/26/2010   Past Surgical History:  Procedure Laterality Date   SKIN CANCER EXCISION     left nose area   VASECTOMY     WISDOM TOOTH EXTRACTION     Social History   Socioeconomic History   Marital status: Married    Spouse name: Not on file   Number of children: 3   Years of education: Not on file   Highest education level: Not on file  Occupational History   Occupation: Art gallery manager  Tobacco Use   Smoking status: Former    Types: Cigars   Smokeless tobacco: Never   Tobacco comments:    occasional cigar  Substance and Sexual Activity   Alcohol use: Yes    Alcohol/week: 6.0 standard drinks of alcohol    Types: 6 Standard drinks or equivalent per week    Comment: moderate   Drug use: No   Sexual activity: Not on file  Other Topics Concern   Not on file  Social History Narrative   Not on file   Social Determinants of Health   Financial Resource Strain: Not on file  Food Insecurity: Not on file  Transportation Needs: Not on file  Physical Activity: Not on file  Stress: Not on file  Social Connections: Not on file   Family History  Problem Relation Age of Onset   Hyperlipidemia Other    Colon cancer Neg Hx    Colon  polyps Neg Hx    Outpatient Encounter Medications as of 12/20/2021  Medication Sig   finasteride (PROSCAR) 5 MG tablet Take 1 tablet (5 mg total) by mouth daily.   losartan (COZAAR) 50 MG tablet Take 1 tablet (50 mg total) by mouth daily.   traMADol (ULTRAM) 50 MG tablet Take 1 tablet (50 mg total) by mouth every 6 (six) hours as needed.   vitamin B-12 (CYANOCOBALAMIN) 250 MCG tablet Take 250 mcg by mouth daily.   zolpidem (AMBIEN) 10 MG tablet TAKE ONE TABLET BY MOUTH AT BEDTIME AS NEEDED SLEEP   No facility-administered encounter medications on file as of 12/20/2021.   ALLERGIES: Allergies  Allergen Reactions   Statins Other (See Comments)    "Makes me feel weird"    VACCINATION STATUS: Immunization History  Administered Date(s) Administered   Td 12/28/2008   Zoster, Live 03/28/2015    HPI Scott Walker is 61 y.o. male who presents today with a medical history as above. he is being seen in follow-up after he was seen in the consultation for hyponatremia  requested by Scott Borg, Scott Walker.   See notes from his previous visit. He denies and his records did not show hypernatremia.    Patient describes polydipsia, and polyuria, however, maximum estimate daily urine output as less than 2 L.  A series of labs showed mild hyponatremia prior to his visit.  After advised on water restrictions, his repeat CMP showed stable sodium at 136. He denies any prior history of head injury, did not have any prior history of diabetes insipidus. He is a former smoker.  His medical history includes hypertension, BPH, ADD. He also reports intermittent dizziness, denies syncope/presyncope.  His a.m. cortisol is normal.  He does have dyslipidemia not on statins.  He has history of intolerance to statins and wishes to have alternative treatment  options for hyperlipidemia. He does report mildly fluctuating body weight, no major recent changes.  Review of Systems  Constitutional: + Mildly fluctuating  body weight, + fatigue, no subjective hyperthermia, no subjective hypothermia Eyes: no blurry vision, no xerophthalmia ENT: no sore throat, no nodules palpated in throat, no dysphagia/odynophagia, no hoarseness   Objective:       12/20/2021    9:00 AM 12/06/2021    9:39 AM 11/02/2021   11:03 AM  Vitals with BMI  Height '5\' 8"'$  '5\' 8"'$    Weight 163 lbs 166 lbs   BMI 44.03 47.42   Systolic 595 638 756  Diastolic 78 80 84  Pulse 84 82     BP 118/78   Pulse 84   Ht '5\' 8"'$  (1.727 m)   Wt 163 lb (73.9 kg)   BMI 24.78 kg/m   Wt Readings from Last 3 Encounters:  12/20/21 163 lb (73.9 kg)  12/06/21 166 lb (75.3 kg)  11/02/21 162 lb (73.5 kg)    Physical Exam  Constitutional:  Body mass index is 24.78 kg/m.,  not in acute distress, normal state of mind Eyes: PERRLA, EOMI, no exophthalmos ENT: moist mucous membranes, no gross thyromegaly, no gross cervical lymphadenopathy Cardiovascular: normal precordial activity, Regular Rate and Rhythm, no Murmur/Rubs/Gallops   CMP ( most recent) CMP     Component Value Date/Time   NA 136 12/12/2021 0831   K 4.6 12/12/2021 0831   CL 101 12/12/2021 0831   CO2 21 12/12/2021 0831   GLUCOSE 91 12/12/2021 0831   GLUCOSE 54 (L) 10/23/2021 1205   BUN 7 (L) 12/12/2021 0831   CREATININE 0.88 12/12/2021 0831   CALCIUM 9.3 12/12/2021 0831   PROT 6.6 12/12/2021 0831   ALBUMIN 4.7 12/12/2021 0831   AST 20 12/12/2021 0831   ALT 18 12/12/2021 0831   ALKPHOS 57 12/12/2021 0831   BILITOT 0.4 12/12/2021 0831   GFRNONAA 85.10 12/23/2008 0953   GFRAA 84 11/13/2007 0838     Diabetic Labs (most recent): Lab Results  Component Value Date   HGBA1C 5.5 06/22/2021     Lipid Panel ( most recent) Lipid Panel     Component Value Date/Time   CHOL 174 06/22/2021 0834   TRIG 68.0 06/22/2021 0834   HDL 50.50 06/22/2021 0834   CHOLHDL 3 06/22/2021 0834   VLDL 13.6 06/22/2021 0834   LDLCALC 109 (H) 06/22/2021 0834   LDLDIRECT 158.0 12/23/2008 0953       Lab Results  Component Value Date   TSH 1.35 06/22/2021   TSH 0.59 10/12/2014   TSH 0.99 07/13/2013   TSH 0.65 04/26/2010   TSH 0.80 12/23/2008   TSH 0.67 11/13/2007   TSH 0.51 03/18/2006  Assessment & Plan:   1. Hyponatremia 2. Mixed hyperlipidemia 3. Vitamin B12 deficiency   - I have reviewed his   new and available endocrine records and clinically evaluated the patient. - Based on these reviews, he has correction of his recent mild hyponatremia, serum and urine osmolality are within normal limits.  He does not have diabetes insipidus.  He remains clinically euvolemic.    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 I advised him to need to restrict free water to half of what he has been consuming.    Gross estimate is 2 L a day, advised to lowered to 1 L a day. He will not be initiated on medications.   Related to his dizziness, he was worked up for adrenal sufficiency with a.m. cortisol which is normal.    His records also show vitamin B12 deficiency.  He is advised to resume his over-the-counter vitamin B12 250 mcg p.o. daily.   Hyperlipidemia: Statin Intolerance.  Patient is at high risk for cardiovascular diseases. - he acknowledges that there is a room for improvement in his food and drink choices. - Suggestion is made for him to avoid simple carbohydrates  from his diet including Cakes, Sweet Desserts, Ice Cream, Soda (diet and regular), Sweet Tea, Candies, Chips, Cookies, Store Bought Juices, Alcohol , Artificial Sweeteners,  Coffee Creamer, and "Sugar-free" Products, Lemonade. This will help patient to have more stable blood glucose profile and potentially avoid unintended weight gain.  The following Lifestyle Medicine recommendations according to [facility]) were discussed and and offered to patient and he  agrees to start the journey:  A. Whole Foods, Plant-Based Nutrition comprising of fruits and vegetables,  plant-based proteins, whole-grain carbohydrates was discussed in detail with the patient.   A list for source of those nutrients were also provided to the patient.  Patient will use only water or unsweetened tea for hydration. B.  The need to stay away from risky substances including alcohol, smoking; obtaining 7 to 9 hours of restorative sleep, at least 150 minutes of moderate intensity exercise weekly, the importance of healthy social connections,  and stress management techniques were discussed. C.  A full color page of  Calorie density of various food groups per pound showing examples of each food groups was provided to the patient.  - he is advised to maintain close follow up with Scott Borg, Scott Walker for primary care needs.   I spent 27 minutes in the care of the patient today including review of labs from Thyroid Function, CMP, and other relevant labs ; imaging/biopsy records (current and previous including abstractions from other facilities); face-to-face time discussing  his lab results and symptoms, medications doses, his options of short and long term treatment based on the latest standards of care / guidelines;   and documenting the encounter.  Scott Walker  participated in the discussions, expressed understanding, and voiced agreement with the above plans.  All questions were answered to his satisfaction. he is encouraged to contact clinic should he have any questions or concerns prior to his return visit.   Follow up plan: Return in about 6 months (around 06/20/2022) for Fasting Labs  in AM B4 8.   Scott Lloyd, Scott Walker [facility] [address] [place], [place] [address] Phone: [phone]  Fax: [phone]     12/20/2021, 9:22 AM  This note was partially dictated with voice recognition software. Similar sounding words can  be transcribed inadequately or may not  be corrected upon review.

## 2022-01-11 ENCOUNTER — Other Ambulatory Visit: Payer: Self-pay | Admitting: Internal Medicine

## 2022-03-07 ENCOUNTER — Other Ambulatory Visit: Payer: Self-pay | Admitting: Internal Medicine

## 2022-03-07 NOTE — Telephone Encounter (Signed)
Please refill as per office routine med refill policy (all routine meds to be refilled for 3 mo or monthly (per pt preference) up to one year from last visit, then month to month grace period for 3 mo, then further med refills will have to be denied)

## 2022-03-08 ENCOUNTER — Other Ambulatory Visit: Payer: Self-pay | Admitting: Internal Medicine

## 2022-03-08 NOTE — Telephone Encounter (Signed)
Please refill as per office routine med refill policy (all routine meds to be refilled for 3 mo or monthly (per pt preference) up to one year from last visit, then month to month grace period for 3 mo, then further med refills will have to be denied) ? ?

## 2022-03-18 ENCOUNTER — Other Ambulatory Visit: Payer: Self-pay | Admitting: Internal Medicine

## 2022-05-24 ENCOUNTER — Other Ambulatory Visit: Payer: Self-pay

## 2022-05-24 MED ORDER — TRAMADOL HCL 50 MG PO TABS: 50.0000 mg | ORAL_TABLET | Freq: Four times a day (QID) | ORAL | 2 refills | Status: DC | PRN

## 2022-05-31 DIAGNOSIS — N401 Enlarged prostate with lower urinary tract symptoms: Secondary | ICD-10-CM | POA: Diagnosis not present

## 2022-05-31 DIAGNOSIS — R350 Frequency of micturition: Secondary | ICD-10-CM | POA: Diagnosis not present

## 2022-05-31 DIAGNOSIS — R6882 Decreased libido: Secondary | ICD-10-CM | POA: Diagnosis not present

## 2022-05-31 DIAGNOSIS — N5201 Erectile dysfunction due to arterial insufficiency: Secondary | ICD-10-CM | POA: Diagnosis not present

## 2022-05-31 DIAGNOSIS — R3912 Poor urinary stream: Secondary | ICD-10-CM | POA: Diagnosis not present

## 2022-06-18 DIAGNOSIS — R350 Frequency of micturition: Secondary | ICD-10-CM | POA: Diagnosis not present

## 2022-06-18 DIAGNOSIS — R6882 Decreased libido: Secondary | ICD-10-CM | POA: Diagnosis not present

## 2022-06-18 DIAGNOSIS — N401 Enlarged prostate with lower urinary tract symptoms: Secondary | ICD-10-CM | POA: Diagnosis not present

## 2022-06-20 ENCOUNTER — Ambulatory Visit: Payer: BC Managed Care – PPO | Admitting: "Endocrinology

## 2022-06-25 ENCOUNTER — Encounter: Payer: Self-pay | Admitting: Internal Medicine

## 2022-06-25 ENCOUNTER — Ambulatory Visit (INDEPENDENT_AMBULATORY_CARE_PROVIDER_SITE_OTHER): Payer: BC Managed Care – PPO | Admitting: Internal Medicine

## 2022-06-25 VITALS — BP 120/78 | HR 85 | Temp 98.4°F | Ht 68.0 in | Wt 168.0 lb

## 2022-06-25 DIAGNOSIS — E7800 Pure hypercholesterolemia, unspecified: Secondary | ICD-10-CM | POA: Diagnosis not present

## 2022-06-25 DIAGNOSIS — E559 Vitamin D deficiency, unspecified: Secondary | ICD-10-CM | POA: Diagnosis not present

## 2022-06-25 DIAGNOSIS — I10 Essential (primary) hypertension: Secondary | ICD-10-CM

## 2022-06-25 DIAGNOSIS — Z0001 Encounter for general adult medical examination with abnormal findings: Secondary | ICD-10-CM | POA: Diagnosis not present

## 2022-06-25 DIAGNOSIS — R739 Hyperglycemia, unspecified: Secondary | ICD-10-CM

## 2022-06-25 DIAGNOSIS — E538 Deficiency of other specified B group vitamins: Secondary | ICD-10-CM

## 2022-06-25 MED ORDER — LOSARTAN POTASSIUM 50 MG PO TABS: 50.0000 mg | ORAL_TABLET | Freq: Every day | ORAL | 3 refills | Status: AC

## 2022-06-25 MED ORDER — TRAMADOL HCL 50 MG PO TABS: 50.0000 mg | ORAL_TABLET | Freq: Four times a day (QID) | ORAL | 2 refills | Status: DC | PRN

## 2022-06-25 NOTE — Progress Notes (Signed)
Patient ID: Scott Walker, male   DOB: [birth year], 61 y.o.   MRN: [id number]         Chief Complaint:: wellness exam and low vit d and B12, hld, htn,        HPI:  Scott Walker is a 62 y.o. male here for wellness exam; for shingrix and tdap at the pharmacy, o/w up to date                        Also Pt denies chest pain, increased sob or doe, wheezing, orthopnea, PND, increased LE swelling, palpitations, dizziness or syncope.   Pt denies polydipsia, polyuria, or new focal neuro s/s.    Pt denies fever, wt loss, night sweats, loss of appetite, or other constitutional symptoms     Wt Readings from Last 3 Encounters:  06/25/22 168 lb (76.2 kg)  12/20/21 163 lb (73.9 kg)  12/06/21 166 lb (75.3 kg)   BP Readings from Last 3 Encounters:  06/25/22 120/78  12/20/21 118/78  12/06/21 128/80   Immunization History  Administered Date(s) Administered   Td 12/28/2008   Td (Adult),5 Lf Tetanus Toxid, Preservative Free 12/28/2008   Tdap 12/28/2008   Zoster, Live 03/28/2015   Health Maintenance Due  Topic Date Due   Zoster Vaccines- Shingrix (1 of 2) Never done   DTaP/Tdap/Td (4 - Td or Tdap) 12/29/2018      Past Medical History:  Diagnosis Date   ADD 02/24/2008   ALLERGIC RHINITIS 11/18/2007   Allergy    ARM PAIN, LEFT 02/24/2008   BENIGN PROSTATIC HYPERTROPHY 11/18/2007   GERD (gastroesophageal reflux disease)    recently-tried pantoprazole with help   HYPERLIPIDEMIA 11/18/2007   INSOMNIA-SLEEP DISORDER-UNSPEC 10/25/2009   LOW BACK PAIN 11/18/2007   PROSTATITIS, ACUTE 02/24/2008   Routine general medical examination at a health care facility 04/26/2010   SINUSITIS- ACUTE-NOS 12/28/2008   Special screening for malignant neoplasms, ovary 04/26/2010   Past Surgical History:  Procedure Laterality Date   SKIN CANCER EXCISION     left nose area   VASECTOMY     WISDOM TOOTH EXTRACTION      reports that he has quit smoking. His smoking use included cigars. He has never used  smokeless tobacco. He reports current alcohol use of about 6.0 standard drinks of alcohol per week. He reports that he does not use drugs. family history includes Hyperlipidemia in an other family member. Allergies  Allergen Reactions   Statins Other (See Comments)    "Makes me feel weird"   Current Outpatient Medications on File Prior to Visit  Medication Sig Dispense Refill   finasteride (PROSCAR) 5 MG tablet Take 1 tablet (5 mg total) by mouth daily. 90 tablet 2   vitamin B-12 (CYANOCOBALAMIN) 250 MCG tablet Take 250 mcg by mouth daily.     zolpidem (AMBIEN) 10 MG tablet TAKE ONE TABLET BY MOUTH AT BEDTIME AS NEEDED FOR SLEEP 90 tablet 1   No current facility-administered medications on file prior to visit.        ROS:  All others reviewed and negative.  Objective        PE:  BP 120/78 (BP Location: Right Arm, Patient Position: Sitting, Cuff Size: Normal)   Pulse 85   Temp 98.4 F (36.9 C) (Oral)   Ht 5\' 8"  (1.727 m)   Wt 168 lb (76.2 kg)   SpO2 98%   BMI 25.54 kg/m  Constitutional: Pt appears in NAD               HENT: Head: NCAT.                Right Ear: External ear normal.                 Left Ear: External ear normal.                Eyes: . Pupils are equal, round, and reactive to light. Conjunctivae and EOM are normal               Nose: without d/c or deformity               Neck: Neck supple. Gross normal ROM               Cardiovascular: Normal rate and regular rhythm.                 Pulmonary/Chest: Effort normal and breath sounds without rales or wheezing.                Abd:  Soft, NT, ND, + BS, no organomegaly               Neurological: Pt is alert. At baseline orientation, motor grossly intact               Skin: Skin is warm. No rashes, no other new lesions, LE edema - none               Psychiatric: Pt behavior is normal without agitation   Micro: none  Cardiac tracings I have personally interpreted today:  none  Pertinent  Radiological findings (summarize): none   Lab Results  Component Value Date   WBC 5.8 10/15/2021   HGB 14.7 10/15/2021   HCT 42.4 10/15/2021   PLT 301.0 10/15/2021   GLUCOSE 91 12/12/2021   CHOL 174 06/22/2021   TRIG 68.0 06/22/2021   HDL 50.50 06/22/2021   LDLDIRECT 158.0 12/23/2008   LDLCALC 109 (H) 06/22/2021   ALT 18 12/12/2021   AST 20 12/12/2021   NA 136 12/12/2021   K 4.6 12/12/2021   CL 101 12/12/2021   CREATININE 0.88 12/12/2021   BUN 7 (L) 12/12/2021   CO2 21 12/12/2021   TSH 1.35 06/22/2021   PSA 0.30 06/22/2021   HGBA1C 5.5 06/22/2021   Assessment/Plan:  Scott Walker is a 62 y.o. White or Caucasian [1] male with  has a past medical history of ADD (02/24/2008), ALLERGIC RHINITIS (11/18/2007), Allergy, ARM PAIN, LEFT (02/24/2008), BENIGN PROSTATIC HYPERTROPHY (11/18/2007), GERD (gastroesophageal reflux disease), HYPERLIPIDEMIA (11/18/2007), INSOMNIA-SLEEP DISORDER-UNSPEC (10/25/2009), LOW BACK PAIN (11/18/2007), PROSTATITIS, ACUTE (02/24/2008), Routine general medical examination at a health care facility (04/26/2010), SINUSITIS- ACUTE-NOS (12/28/2008), and Special screening for malignant neoplasms, ovary (04/26/2010).  Vitamin D deficiency Last vitamin D Lab Results  Component Value Date   VD25OH 30.98 06/22/2021   Low, to start oral replacement   Encounter for well adult exam with abnormal findings Age and sex appropriate education and counseling updated with regular exercise and diet Referrals for preventative services - none needed Immunizations addressed - for shingrix and tdap at pharmacy Smoking counseling  - none needed Evidence for depression or other mood disorder - none significant Most recent labs reviewed. I have personally reviewed and have noted: 1) the patient's medical and social history 2) The patient's current medications and supplements 3) The patient's height, weight, and BMI have been  recorded in the chart   HLD (hyperlipidemia) Lab  Results  Component Value Date   LDLCALC 109 (H) 06/22/2021   Uncontrolled, goal ldl < 100, pt for low chol diet, declines statin for now   Essential hypertension BP Readings from Last 3 Encounters:  06/25/22 120/78  12/20/21 118/78  12/06/21 128/80   Stable, pt to continue medical treatment losartan 50 qd   Vitamin B12 deficiency Lab Results  Component Value Date   VITAMINB12 122 (L) 06/22/2021   Low to start oral replacement - b12 1000 mcg qd  Followup: Return in about 1 year (around 06/25/2023).  Oliver Barre, MD 06/26/2022 8:26 PM [facility] Internal Medicine

## 2022-06-25 NOTE — Patient Instructions (Addendum)
Please have your Shingrix (shingles) shots done at your local pharmacy, and the Tdap tetanus shot  Please continue all other medications as before, and refills have been done if requested.  Please have the pharmacy call with any other refills you may need.  Please continue your efforts at being more active, low cholesterol diet, and weight control.  You are otherwise up to date with prevention measures today.  Please keep your appointments with your specialists as you may have planned  Please make sure to see Dermatology for your next appt soon  Please go to the LAB at the blood drawing area for the tests to be done  You will be contacted by phone if any changes need to be made immediately.  Otherwise, you will receive a letter about your results with an explanation, but please check with MyChart first.  Please remember to sign up for MyChart if you have not done so, as this will be important to you in the future with finding out test results, communicating by private email, and scheduling acute appointments online when needed.  Please make an Appointment to return for your 1 year visit, or sooner if needed

## 2022-06-25 NOTE — Assessment & Plan Note (Signed)
Last vitamin D Lab Results  Component Value Date   VD25OH 30.98 06/22/2021   Low, to start oral replacement

## 2022-06-26 ENCOUNTER — Encounter: Payer: Self-pay | Admitting: Internal Medicine

## 2022-06-26 ENCOUNTER — Telehealth: Payer: Self-pay

## 2022-06-26 NOTE — Assessment & Plan Note (Signed)
BP Readings from Last 3 Encounters:  06/25/22 120/78  12/20/21 118/78  12/06/21 128/80   Stable, pt to continue medical treatment losartan 50 qd

## 2022-06-26 NOTE — Assessment & Plan Note (Signed)
Age and sex appropriate education and counseling updated with regular exercise and diet Referrals for preventative services - none needed Immunizations addressed - for shingrix and tdap at pharmacy Smoking counseling  - none needed Evidence for depression or other mood disorder - none significant Most recent labs reviewed. I have personally reviewed and have noted: 1) the patient's medical and social history 2) The patient's current medications and supplements 3) The patient's height, weight, and BMI have been recorded in the chart  

## 2022-06-26 NOTE — Assessment & Plan Note (Signed)
Lab Results  Component Value Date   VITAMINB12 122 (L) 06/22/2021   Low to start oral replacement - b12 1000 mcg qd

## 2022-06-26 NOTE — Assessment & Plan Note (Signed)
Lab Results  Component Value Date   LDLCALC 109 (H) 06/22/2021   Uncontrolled, goal ldl < 100, pt for low chol diet, declines statin for now

## 2022-06-26 NOTE — Telephone Encounter (Signed)
*  Primary  PA request received via CMM for traMADol HCl 50MG  tablets  PA submitted to [employer] via CMM and is pending additional questions/determination  Key: [id number]

## 2022-07-05 NOTE — Telephone Encounter (Signed)
PA has been APPROVED from 06/26/2022-12/23/2022

## 2022-09-17 ENCOUNTER — Other Ambulatory Visit: Payer: Self-pay | Admitting: Internal Medicine

## 2022-10-25 IMAGING — DX DG CERVICAL SPINE 2 OR 3 VIEWS
4 series · 4 of 4 positions shown · non-contrast
Comparison: None.

CLINICAL DATA: 59-year-old male with neck pain.

EXAM:
CERVICAL SPINE - 2-3 VIEW

[c-spine lat]
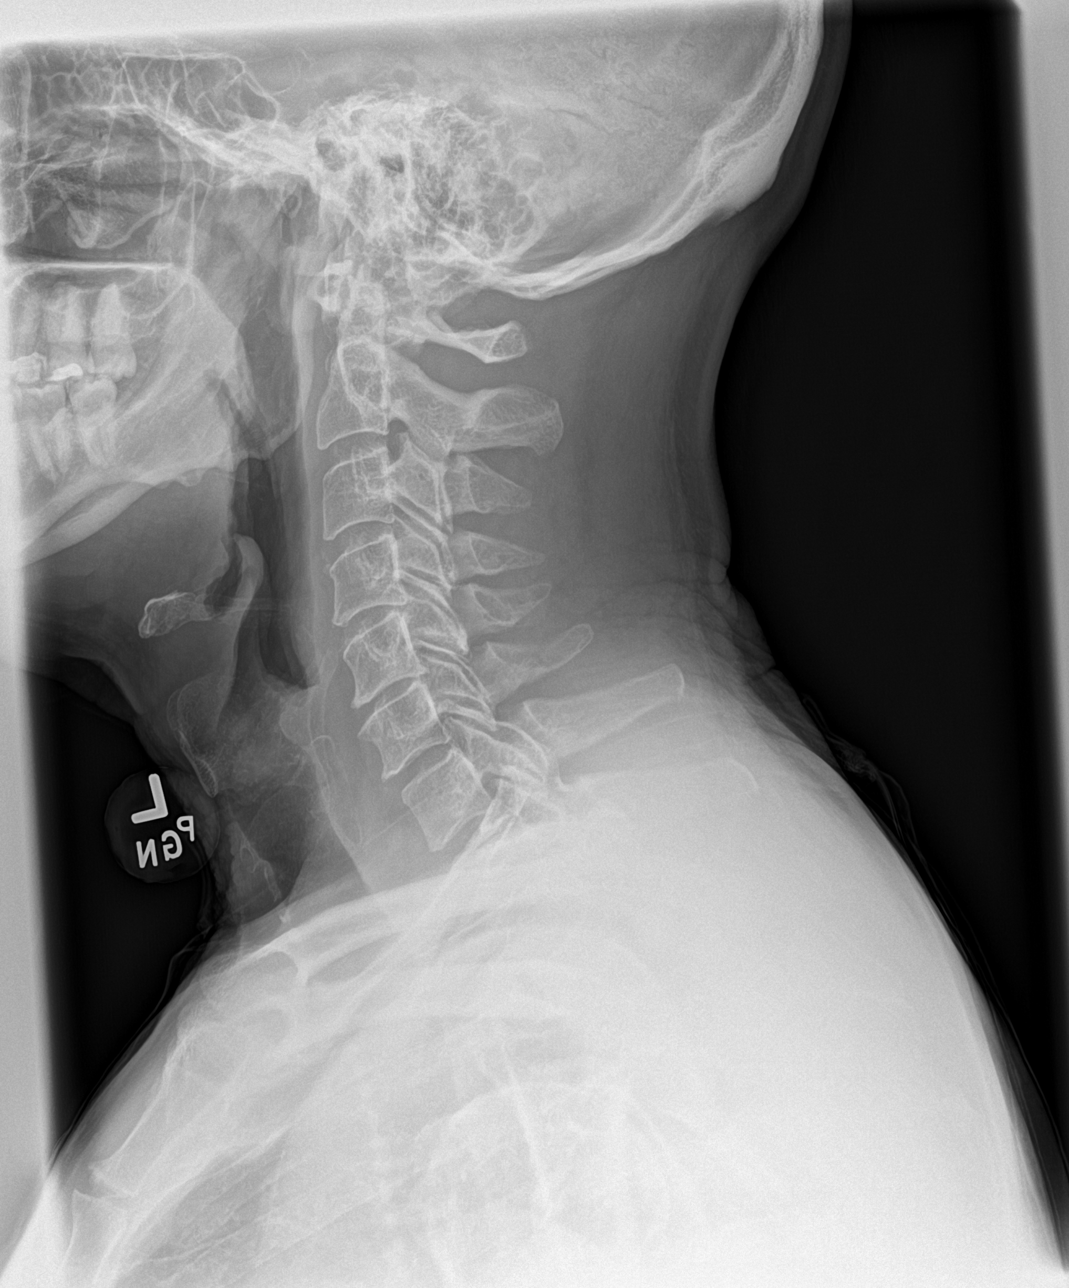

[c-spine ap]
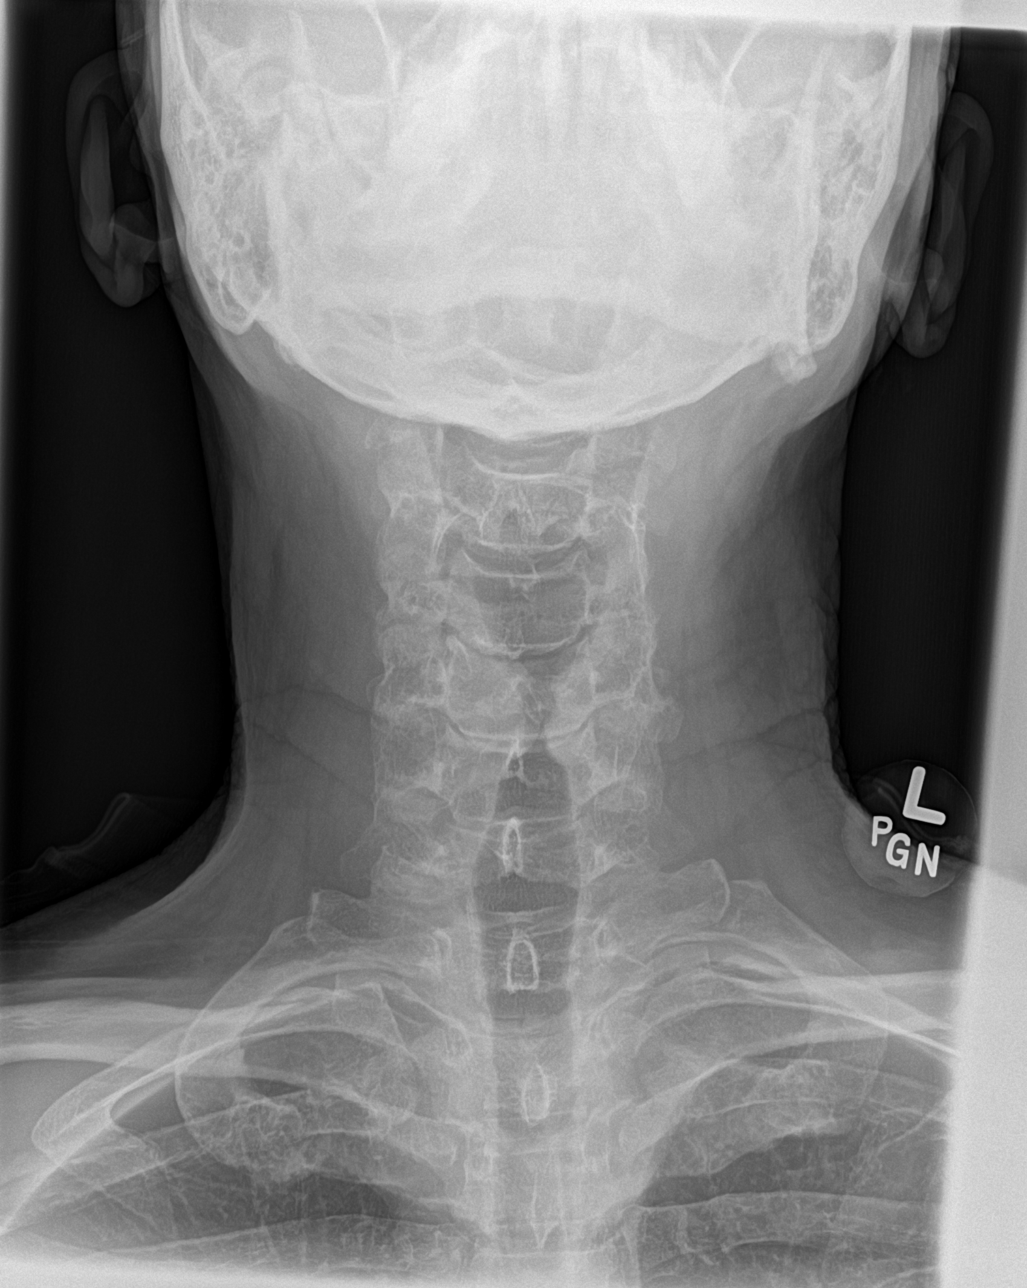

[c-spine open mouth (1 of 2)]
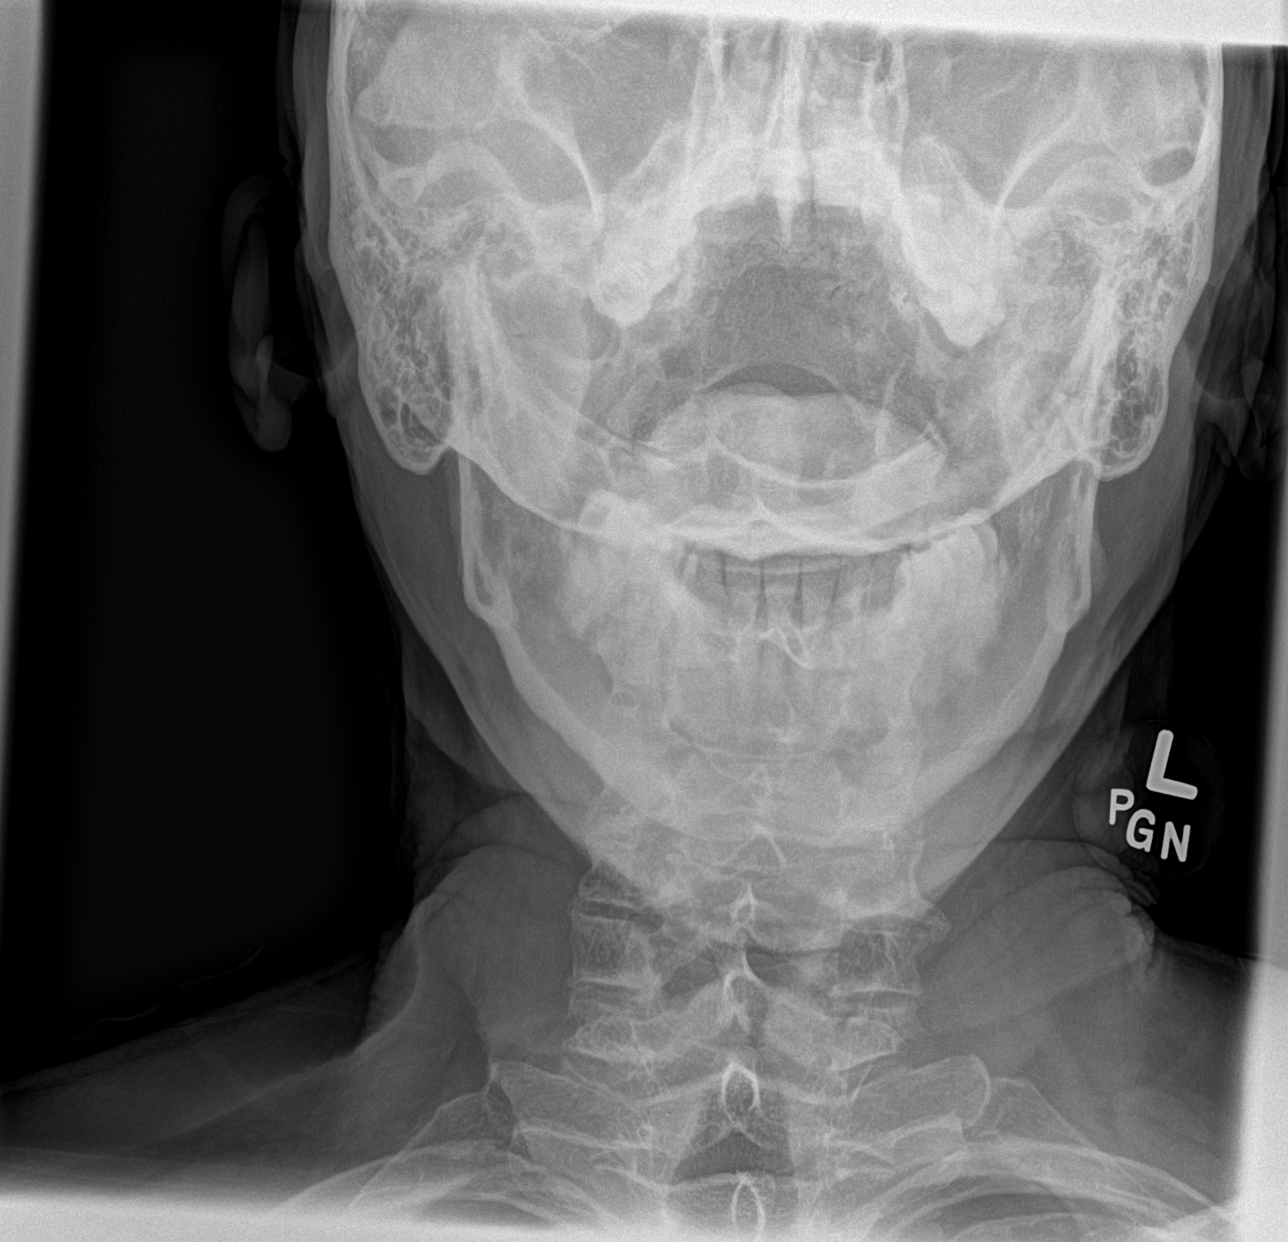

[c-spine open mouth (2 of 2)]
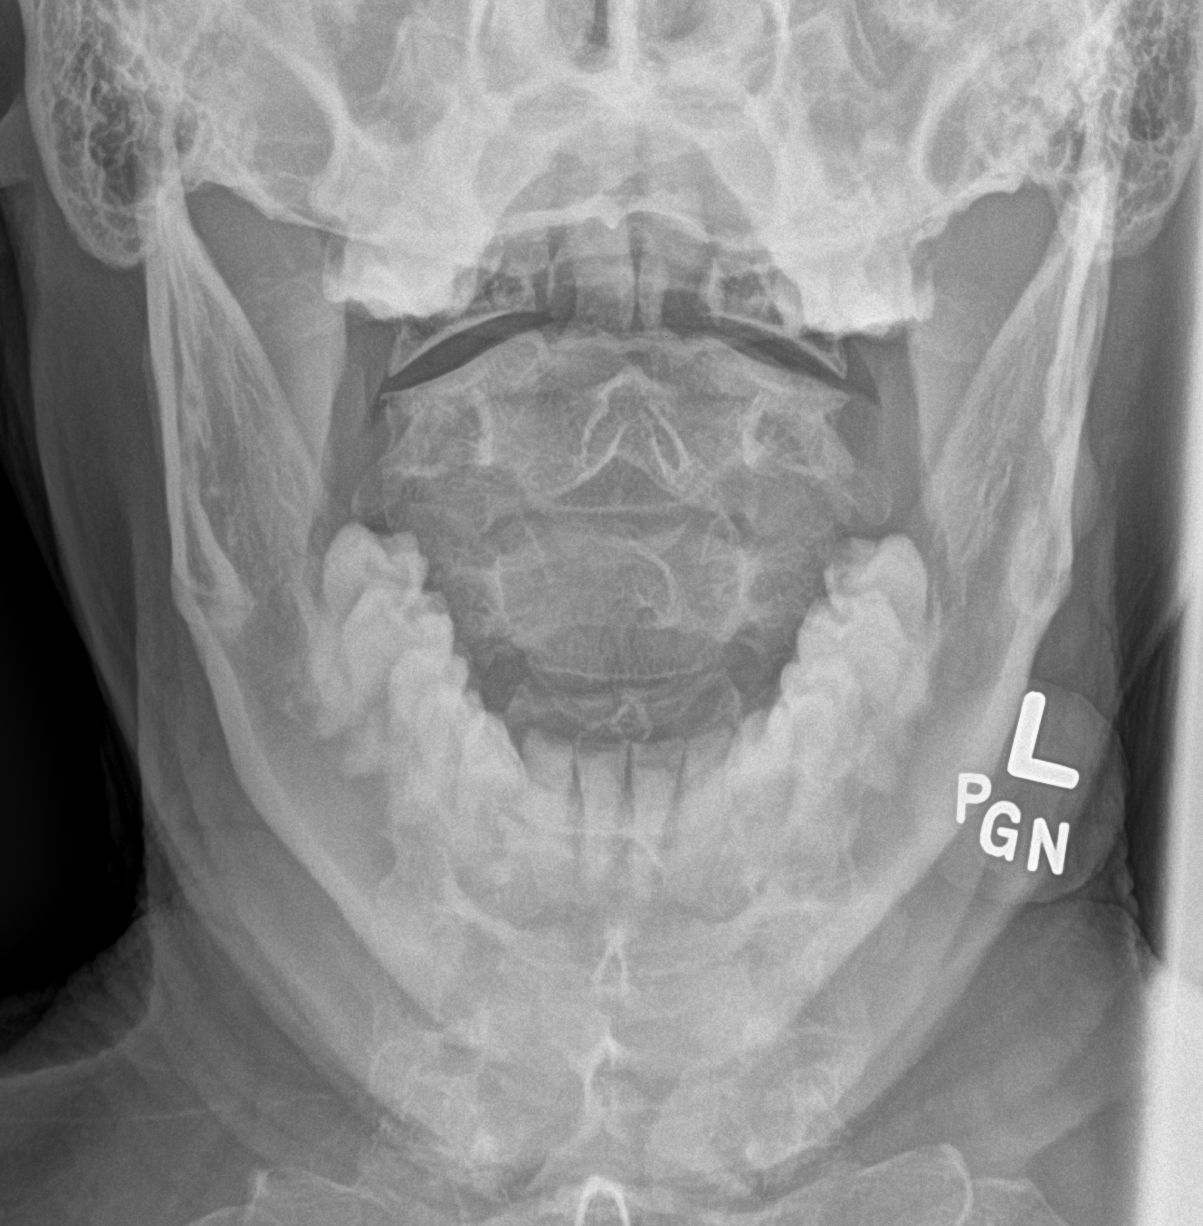

[4 of 4 positions shown; findings below may reference images not displayed]

FINDINGS: There is no acute fracture or subluxation of the cervical spine.
Mild degenerative changes with disc space narrowing and spurring
primarily at C5-C6 and C4-C5. Grade 1 C4-C5 and C5-C6
retrolisthesis. The visualized posterior elements and odontoid are
intact. There is anatomic alignment of the lateral masses of C1 and
C2. The soft tissues are unremarkable.
IMPRESSION: 1. No acute/traumatic cervical spine pathology.
2. Mild degenerative changes.

## 2023-01-10 ENCOUNTER — Other Ambulatory Visit: Payer: Self-pay

## 2023-01-10 MED ORDER — FINASTERIDE 5 MG PO TABS: 5.0000 mg | ORAL_TABLET | Freq: Every day | ORAL | 2 refills | Status: DC

## 2023-01-25 ENCOUNTER — Other Ambulatory Visit: Payer: Self-pay | Admitting: Internal Medicine

## 2023-07-18 ENCOUNTER — Other Ambulatory Visit: Payer: Self-pay | Admitting: Internal Medicine

## 2023-08-21 ENCOUNTER — Encounter: Payer: Self-pay | Admitting: Internal Medicine

## 2023-08-22 ENCOUNTER — Telehealth: Payer: Self-pay

## 2023-08-22 NOTE — Telephone Encounter (Signed)
 Copied from CRM [id number]. Topic: General - Other >> Aug 22, 2023  3:16 PM Jayma L wrote: Reason for CRM: patient asking if he can get labs added to system so he can get them done on Monday before his physical , asking for a callback to know they have been added.

## 2023-08-26 ENCOUNTER — Ambulatory Visit: Payer: Self-pay | Admitting: Internal Medicine

## 2023-08-26 ENCOUNTER — Ambulatory Visit (INDEPENDENT_AMBULATORY_CARE_PROVIDER_SITE_OTHER): Payer: PRIVATE HEALTH INSURANCE | Admitting: Internal Medicine

## 2023-08-26 ENCOUNTER — Encounter: Payer: Self-pay | Admitting: Internal Medicine

## 2023-08-26 VITALS — BP 140/86 | HR 61 | Temp 98.0°F | Ht 68.0 in | Wt 158.2 lb

## 2023-08-26 DIAGNOSIS — E7800 Pure hypercholesterolemia, unspecified: Secondary | ICD-10-CM

## 2023-08-26 DIAGNOSIS — E538 Deficiency of other specified B group vitamins: Secondary | ICD-10-CM

## 2023-08-26 DIAGNOSIS — Z0000 Encounter for general adult medical examination without abnormal findings: Secondary | ICD-10-CM

## 2023-08-26 DIAGNOSIS — E559 Vitamin D deficiency, unspecified: Secondary | ICD-10-CM

## 2023-08-26 DIAGNOSIS — Z0001 Encounter for general adult medical examination with abnormal findings: Secondary | ICD-10-CM

## 2023-08-26 DIAGNOSIS — R739 Hyperglycemia, unspecified: Secondary | ICD-10-CM

## 2023-08-26 DIAGNOSIS — I10 Essential (primary) hypertension: Secondary | ICD-10-CM

## 2023-08-26 LAB — CBC WITH DIFFERENTIAL/PLATELET
Basophils Absolute: 0 K/uL (ref 0.0–0.1)
Basophils Relative: 0.7 % (ref 0.0–3.0)
Eosinophils Absolute: 0.2 K/uL (ref 0.0–0.7)
Eosinophils Relative: 3.4 % (ref 0.0–5.0)
HCT: 45.8 % (ref 39.0–52.0)
Hemoglobin: 15.4 g/dL (ref 13.0–17.0)
Lymphocytes Relative: 38.4 % (ref 12.0–46.0)
Lymphs Abs: 2.3 K/uL (ref 0.7–4.0)
MCHC: 33.7 g/dL (ref 30.0–36.0)
MCV: 94.5 fl (ref 78.0–100.0)
Monocytes Absolute: 0.6 K/uL (ref 0.1–1.0)
Monocytes Relative: 10.7 % (ref 3.0–12.0)
Neutro Abs: 2.7 K/uL (ref 1.4–7.7)
Neutrophils Relative %: 46.8 % (ref 43.0–77.0)
Platelets: 319 K/uL (ref 150.0–400.0)
RBC: 4.84 Mil/uL (ref 4.22–5.81)
RDW: 13.5 % (ref 11.5–15.5)
WBC: 5.9 K/uL (ref 4.0–10.5)

## 2023-08-26 LAB — VITAMIN D 25 HYDROXY (VIT D DEFICIENCY, FRACTURES): VITD: 37.17 ng/mL (ref 30.00–100.00)

## 2023-08-26 LAB — HEPATIC FUNCTION PANEL
ALT: 18 U/L (ref 0–53)
AST: 23 U/L (ref 0–37)
Albumin: 4.5 g/dL (ref 3.5–5.2)
Alkaline Phosphatase: 42 U/L (ref 39–117)
Bilirubin, Direct: 0.1 mg/dL (ref 0.0–0.3)
Total Bilirubin: 0.5 mg/dL (ref 0.2–1.2)
Total Protein: 6.9 g/dL (ref 6.0–8.3)

## 2023-08-26 LAB — TSH: TSH: 1.11 u[IU]/mL (ref 0.35–5.50)

## 2023-08-26 LAB — URINALYSIS, ROUTINE W REFLEX MICROSCOPIC
Bilirubin Urine: NEGATIVE
Hgb urine dipstick: NEGATIVE
Ketones, ur: NEGATIVE
Leukocytes,Ua: NEGATIVE
Nitrite: NEGATIVE
Specific Gravity, Urine: 1.015 (ref 1.000–1.030)
Total Protein, Urine: NEGATIVE
Urine Glucose: NEGATIVE
Urobilinogen, UA: 0.2 (ref 0.0–1.0)
pH: 6 (ref 5.0–8.0)

## 2023-08-26 LAB — LIPID PANEL
Cholesterol: 164 mg/dL (ref 0–200)
HDL: 50.7 mg/dL (ref >39.00)
LDL Cholesterol: 99 mg/dL (ref 0–99)
NonHDL: 112.84
Total CHOL/HDL Ratio: 3
Triglycerides: 70 mg/dL (ref 0.0–149.0)
VLDL: 14 mg/dL (ref 0.0–40.0)

## 2023-08-26 LAB — BASIC METABOLIC PANEL WITH GFR
BUN: 11 mg/dL (ref 6–23)
CO2: 27 meq/L (ref 19–32)
Calcium: 9.2 mg/dL (ref 8.4–10.5)
Chloride: 104 meq/L (ref 96–112)
Creatinine, Ser: 0.89 mg/dL (ref 0.40–1.50)
GFR: 92.33 mL/min (ref >60.00)
Glucose, Bld: 91 mg/dL (ref 70–99)
Potassium: 4.1 meq/L (ref 3.5–5.1)
Sodium: 137 meq/L (ref 135–145)

## 2023-08-26 LAB — HEMOGLOBIN A1C: Hgb A1c MFr Bld: 5.8 % (ref 4.6–6.5)

## 2023-08-26 LAB — VITAMIN B12: Vitamin B-12: 860 pg/mL (ref 211–911)

## 2023-08-26 NOTE — Patient Instructions (Signed)
 Please have your Shingrix (shingles) shots done at your local pharmacy, and the Tdap tetanus shot  Please continue all other medications as before, and refills have been done if requested.  Please have the pharmacy call with any other refills you may need.  Please continue your efforts at being more active, low cholesterol diet, and weight control.  You are otherwise up to date with prevention measures today.  Please keep your appointments with your specialists as you may have planned  Please go to the LAB at the blood drawing area for the tests to be done  You will be contacted by phone if any changes need to be made immediately.  Otherwise, you will receive a letter about your results with an explanation, but please check with MyChart first.  Please make an Appointment to return for your 1 year visit, or sooner if needed

## 2023-08-26 NOTE — Progress Notes (Signed)
 The test results show that your current treatment is OK, as the tests are stable.  Please continue the same plan.  There is no other need for change of treatment or further evaluation based on these results, at this time.  thanks

## 2023-08-26 NOTE — Progress Notes (Unsigned)
 Patient ID: Scott Walker, male   DOB: 08-23-1960, 63 y.o.   MRN: [id number]         Chief Complaint:: wellness exam and htn, hld, low b12 and Vit D       HPI:  Scott Walker is a 63 y.o. male here for wellness exam; for shingrix and tdap at pharmacy                        Also Pt denies chest pain, increased sob or doe, wheezing, orthopnea, PND, increased LE swelling, palpitations, dizziness or syncope.   Pt denies polydipsia, polyuria, or new focal neuro s/s.    Pt denies fever, wt loss, night sweats, loss of appetite, or other constitutional symptoms     Wt Readings from Last 3 Encounters:  08/26/23 158 lb 3.2 oz (71.8 kg)  06/25/22 168 lb (76.2 kg)  12/20/21 163 lb (73.9 kg)   BP Readings from Last 3 Encounters:  08/26/23 (!) 140/86  06/25/22 120/78  12/20/21 118/78   Immunization History  Administered Date(s) Administered   Td 12/28/2008   Td (Adult),5 Lf Tetanus Toxid, Preservative Free 12/28/2008   Tdap 12/28/2008   Zoster, Live 03/28/2015   Health Maintenance Due  Topic Date Due   Zoster Vaccines- Shingrix (1 of 2) 11/22/2011   DTaP/Tdap/Td (4 - Td or Tdap) 12/29/2018      Past Medical History:  Diagnosis Date   ADD 02/24/2008   ALLERGIC RHINITIS 11/18/2007   Allergy    ARM PAIN, LEFT 02/24/2008   BENIGN PROSTATIC HYPERTROPHY 11/18/2007   GERD (gastroesophageal reflux disease)    recently-tried pantoprazole with help   HYPERLIPIDEMIA 11/18/2007   INSOMNIA-SLEEP DISORDER-UNSPEC 10/25/2009   LOW BACK PAIN 11/18/2007   PROSTATITIS, ACUTE 02/24/2008   Routine general medical examination at a health care facility 04/26/2010   SINUSITIS- ACUTE-NOS 12/28/2008   Special screening for malignant neoplasms, ovary 04/26/2010   Past Surgical History:  Procedure Laterality Date   SKIN CANCER EXCISION     left nose area   VASECTOMY     WISDOM TOOTH EXTRACTION      reports that he has quit smoking. His smoking use included cigars. He has never used smokeless  tobacco. He reports current alcohol use of about 6.0 standard drinks of alcohol per week. He reports that he does not use drugs. family history includes Hyperlipidemia in an other family member. Allergies  Allergen Reactions   Statins Other (See Comments)    Makes me feel weird   Current Outpatient Medications on File Prior to Visit  Medication Sig Dispense Refill   finasteride  (PROSCAR ) 5 MG tablet Take 1 tablet (5 mg total) by mouth daily. 90 tablet 2   losartan  (COZAAR ) 50 MG tablet Take 1 tablet (50 mg total) by mouth daily. 90 tablet 3   traMADol  (ULTRAM ) 50 MG tablet Take 1 tablet (50 mg total) by mouth every 6 (six) hours as needed. 120 tablet 0   vitamin B-12 (CYANOCOBALAMIN ) 250 MCG tablet Take 250 mcg by mouth daily.     zolpidem  (AMBIEN ) 10 MG tablet TAKE ONE TABLET BY MOUTH AT BEDTIME AS NEEDED SLEEP 90 tablet 0   No current facility-administered medications on file prior to visit.        ROS:  All others reviewed and negative.  Objective        PE:  BP (!) 140/86   Pulse 61   Temp 98 F (36.7 C)  Ht 5' 8 (1.727 m)   Wt 158 lb 3.2 oz (71.8 kg)   SpO2 98%   BMI 24.05 kg/m                 Constitutional: Pt appears in NAD               HENT: Head: NCAT.                Right Ear: External ear normal.                 Left Ear: External ear normal.                Eyes: . Pupils are equal, round, and reactive to light. Conjunctivae and EOM are normal               Nose: without d/c or deformity               Neck: Neck supple. Gross normal ROM               Cardiovascular: Normal rate and regular rhythm.                 Pulmonary/Chest: Effort normal and breath sounds without rales or wheezing.                Abd:  Soft, NT, ND, + BS, no organomegaly               Neurological: Pt is alert. At baseline orientation, motor grossly intact               Skin: Skin is warm. No rashes, no other new lesions, LE edema - none               Psychiatric: Pt behavior is  normal without agitation   Micro: none  Cardiac tracings I have personally interpreted today:  none  Pertinent Radiological findings (summarize): none   Lab Results  Component Value Date   WBC 5.9 08/26/2023   HGB 15.4 08/26/2023   HCT 45.8 08/26/2023   PLT 319.0 08/26/2023   GLUCOSE 91 08/26/2023   CHOL 164 08/26/2023   TRIG 70.0 08/26/2023   HDL 50.70 08/26/2023   LDLDIRECT 158.0 12/23/2008   LDLCALC 99 08/26/2023   ALT 18 08/26/2023   AST 23 08/26/2023   NA 137 08/26/2023   K 4.1 08/26/2023   CL 104 08/26/2023   CREATININE 0.89 08/26/2023   BUN 11 08/26/2023   CO2 27 08/26/2023   TSH 1.11 08/26/2023   PSA 0.30 06/22/2021   HGBA1C 5.8 08/26/2023   Assessment/Plan:  Scott Walker is a 63 y.o. White or Caucasian [1] male with  has a past medical history of ADD (02/24/2008), ALLERGIC RHINITIS (11/18/2007), Allergy, ARM PAIN, LEFT (02/24/2008), BENIGN PROSTATIC HYPERTROPHY (11/18/2007), GERD (gastroesophageal reflux disease), HYPERLIPIDEMIA (11/18/2007), INSOMNIA-SLEEP DISORDER-UNSPEC (10/25/2009), LOW BACK PAIN (11/18/2007), PROSTATITIS, ACUTE (02/24/2008), Routine general medical examination at a health care facility (04/26/2010), SINUSITIS- ACUTE-NOS (12/28/2008), and Special screening for malignant neoplasms, ovary (04/26/2010).  Preventative health care Age and sex appropriate education and counseling updated with regular exercise and diet Referrals for preventative services - none needed Immunizations addressed - for shingrix and tdap at pharmacy Smoking counseling  - none needed Evidence for depression or other mood disorder - none significant Most recent labs reviewed. I have personally reviewed and have noted: 1) the patient's medical and social history 2) The patient's current medications and supplements 3) The patient's  height, weight, and BMI have been recorded in the chart   Essential hypertension BP Readings from Last 3 Encounters:  08/26/23 (!) 140/86   06/25/22 120/78  12/20/21 118/78   Uncontrolled but pt states controlled at home, pt to continue medical treatment losartan  50 qd   HLD (hyperlipidemia) Lab Results  Component Value Date   LDLCALC 99 08/26/2023   Stable, pt to continue low chol diet, consider card cT score   Vitamin B12 deficiency Lab Results  Component Value Date   VITAMINB12 860 08/26/2023   Stable, cont oral replacement - b12 1000 mcg qd   Vitamin D  deficiency Last vitamin D  Lab Results  Component Value Date   VD25OH 37.17 08/26/2023   Low, to start oral replacement  Followup: No follow-ups on file.  Lynwood Rush, MD 08/27/2023 9:20 PM [facility]

## 2023-08-27 NOTE — Assessment & Plan Note (Signed)
 Last vitamin D  Lab Results  Component Value Date   VD25OH 37.17 08/26/2023   Low, to start oral replacement

## 2023-08-27 NOTE — Assessment & Plan Note (Signed)
 Lab Results  Component Value Date   LDLCALC 99 08/26/2023   Stable, pt to continue low chol diet, consider card cT score

## 2023-08-27 NOTE — Assessment & Plan Note (Signed)
 Lab Results  Component Value Date   VITAMINB12 860 08/26/2023   Stable, cont oral replacement - b12 1000 mcg qd

## 2023-08-27 NOTE — Assessment & Plan Note (Signed)
Age and sex appropriate education and counseling updated with regular exercise and diet Referrals for preventative services - none needed Immunizations addressed - for shingrix and tdap at pharmacy Smoking counseling  - none needed Evidence for depression or other mood disorder - none significant Most recent labs reviewed. I have personally reviewed and have noted: 1) the patient's medical and social history 2) The patient's current medications and supplements 3) The patient's height, weight, and BMI have been recorded in the chart  

## 2023-08-27 NOTE — Assessment & Plan Note (Signed)
 BP Readings from Last 3 Encounters:  08/26/23 (!) 140/86  06/25/22 120/78  12/20/21 118/78   Uncontrolled but pt states controlled at home, pt to continue medical treatment losartan  50 qd

## 2023-09-26 ENCOUNTER — Other Ambulatory Visit: Payer: Self-pay | Admitting: Internal Medicine

## 2023-11-11 ENCOUNTER — Other Ambulatory Visit: Payer: Self-pay | Admitting: Internal Medicine

## 2023-12-10 ENCOUNTER — Other Ambulatory Visit: Payer: Self-pay | Admitting: Internal Medicine

## 2024-01-13 ENCOUNTER — Other Ambulatory Visit: Payer: Self-pay | Admitting: Internal Medicine

## 2024-01-14 ENCOUNTER — Other Ambulatory Visit: Payer: Self-pay
